# Patient Record
Sex: Female | Born: 1975 | Race: White | Hispanic: No | Marital: Married | Smoking: Never smoker
Health system: Southern US, Community
[De-identification: ages and names within clinical notes are randomized; demographics above are authoritative.]

## PROBLEM LIST (undated history)

## (undated) DIAGNOSIS — F32A Depression, unspecified: Secondary | ICD-10-CM

## (undated) DIAGNOSIS — T7840XA Allergy, unspecified, initial encounter: Secondary | ICD-10-CM

## (undated) DIAGNOSIS — F419 Anxiety disorder, unspecified: Secondary | ICD-10-CM

## (undated) DIAGNOSIS — G902 Horner's syndrome: Secondary | ICD-10-CM

## (undated) DIAGNOSIS — D649 Anemia, unspecified: Secondary | ICD-10-CM

## (undated) DIAGNOSIS — Z87442 Personal history of urinary calculi: Secondary | ICD-10-CM

## (undated) DIAGNOSIS — Z98811 Dental restoration status: Secondary | ICD-10-CM

## (undated) DIAGNOSIS — G5601 Carpal tunnel syndrome, right upper limb: Secondary | ICD-10-CM

## (undated) HISTORY — DX: Allergy, unspecified, initial encounter: T78.40XA

## (undated) HISTORY — DX: Anemia, unspecified: D64.9

## (undated) HISTORY — PX: EYE SURGERY: SHX253

---

## 1998-01-15 ENCOUNTER — Emergency Department (HOSPITAL_COMMUNITY): Admission: EM | Admit: 1998-01-15 | Discharge: 1998-01-15 | Payer: Self-pay | Admitting: Emergency Medicine

## 1998-01-24 ENCOUNTER — Emergency Department (HOSPITAL_COMMUNITY): Admission: EM | Admit: 1998-01-24 | Discharge: 1998-01-24 | Payer: Self-pay | Admitting: Emergency Medicine

## 1998-06-05 ENCOUNTER — Other Ambulatory Visit: Admission: RE | Admit: 1998-06-05 | Discharge: 1998-06-05 | Payer: Self-pay | Admitting: Obstetrics and Gynecology

## 1998-07-23 ENCOUNTER — Other Ambulatory Visit: Admission: RE | Admit: 1998-07-23 | Discharge: 1998-07-23 | Payer: Self-pay | Admitting: *Deleted

## 1998-11-20 ENCOUNTER — Other Ambulatory Visit: Admission: RE | Admit: 1998-11-20 | Discharge: 1998-11-20 | Payer: Self-pay | Admitting: *Deleted

## 1998-11-20 ENCOUNTER — Other Ambulatory Visit: Admission: RE | Admit: 1998-11-20 | Discharge: 1998-11-20 | Payer: Self-pay | Admitting: Obstetrics and Gynecology

## 1999-04-14 ENCOUNTER — Other Ambulatory Visit: Admission: RE | Admit: 1999-04-14 | Discharge: 1999-04-14 | Payer: Self-pay | Admitting: Obstetrics and Gynecology

## 1999-10-15 ENCOUNTER — Other Ambulatory Visit: Admission: RE | Admit: 1999-10-15 | Discharge: 1999-10-15 | Payer: Self-pay | Admitting: *Deleted

## 1999-12-05 ENCOUNTER — Inpatient Hospital Stay (HOSPITAL_COMMUNITY): Admission: AD | Admit: 1999-12-05 | Discharge: 1999-12-05 | Payer: Self-pay | Admitting: Obstetrics and Gynecology

## 1999-12-10 ENCOUNTER — Inpatient Hospital Stay (HOSPITAL_COMMUNITY): Admission: AD | Admit: 1999-12-10 | Discharge: 1999-12-12 | Payer: Self-pay | Admitting: Obstetrics and Gynecology

## 2000-01-08 ENCOUNTER — Other Ambulatory Visit: Admission: RE | Admit: 2000-01-08 | Discharge: 2000-01-08 | Payer: Self-pay | Admitting: Obstetrics and Gynecology

## 2000-03-01 ENCOUNTER — Encounter (INDEPENDENT_AMBULATORY_CARE_PROVIDER_SITE_OTHER): Payer: Self-pay | Admitting: Specialist

## 2000-03-01 ENCOUNTER — Other Ambulatory Visit: Admission: RE | Admit: 2000-03-01 | Discharge: 2000-03-01 | Payer: Self-pay | Admitting: Obstetrics and Gynecology

## 2000-06-07 ENCOUNTER — Other Ambulatory Visit: Admission: RE | Admit: 2000-06-07 | Discharge: 2000-06-07 | Payer: Self-pay | Admitting: Obstetrics and Gynecology

## 2000-10-13 ENCOUNTER — Other Ambulatory Visit: Admission: RE | Admit: 2000-10-13 | Discharge: 2000-10-13 | Payer: Self-pay | Admitting: Obstetrics and Gynecology

## 2001-02-14 ENCOUNTER — Other Ambulatory Visit: Admission: RE | Admit: 2001-02-14 | Discharge: 2001-02-14 | Payer: Self-pay | Admitting: Obstetrics and Gynecology

## 2001-09-05 ENCOUNTER — Other Ambulatory Visit: Admission: RE | Admit: 2001-09-05 | Discharge: 2001-09-05 | Payer: Self-pay | Admitting: Obstetrics and Gynecology

## 2002-02-20 ENCOUNTER — Other Ambulatory Visit: Admission: RE | Admit: 2002-02-20 | Discharge: 2002-02-20 | Payer: Self-pay | Admitting: Obstetrics and Gynecology

## 2003-05-02 ENCOUNTER — Other Ambulatory Visit: Admission: RE | Admit: 2003-05-02 | Discharge: 2003-05-02 | Payer: Self-pay | Admitting: Obstetrics and Gynecology

## 2003-11-20 ENCOUNTER — Other Ambulatory Visit: Admission: RE | Admit: 2003-11-20 | Discharge: 2003-11-20 | Payer: Self-pay | Admitting: Obstetrics and Gynecology

## 2004-05-07 ENCOUNTER — Inpatient Hospital Stay (HOSPITAL_COMMUNITY): Admission: AD | Admit: 2004-05-07 | Discharge: 2004-05-07 | Payer: Self-pay | Admitting: Obstetrics and Gynecology

## 2004-05-08 ENCOUNTER — Inpatient Hospital Stay (HOSPITAL_COMMUNITY): Admission: AD | Admit: 2004-05-08 | Discharge: 2004-05-08 | Payer: Self-pay | Admitting: Obstetrics and Gynecology

## 2004-06-12 ENCOUNTER — Inpatient Hospital Stay (HOSPITAL_COMMUNITY): Admission: AD | Admit: 2004-06-12 | Discharge: 2004-06-14 | Payer: Self-pay | Admitting: Obstetrics and Gynecology

## 2004-07-09 ENCOUNTER — Encounter: Admission: RE | Admit: 2004-07-09 | Discharge: 2004-07-09 | Payer: Self-pay | Admitting: Obstetrics and Gynecology

## 2009-05-25 ENCOUNTER — Emergency Department (HOSPITAL_COMMUNITY): Admission: EM | Admit: 2009-05-25 | Discharge: 2009-05-25 | Payer: Self-pay | Admitting: Family Medicine

## 2010-09-29 ENCOUNTER — Ambulatory Visit (HOSPITAL_COMMUNITY)
Admission: RE | Admit: 2010-09-29 | Discharge: 2010-09-29 | Disposition: A | Discharge status: Home / Self Care | Payer: BC Managed Care – PPO | Source: Ambulatory Visit | Attending: Urology | Admitting: Urology

## 2010-09-29 DIAGNOSIS — N201 Calculus of ureter: Secondary | ICD-10-CM | POA: Insufficient documentation

## 2010-09-29 HISTORY — PX: EXTRACORPOREAL SHOCK WAVE LITHOTRIPSY: SHX1557

## 2010-09-29 LAB — PREGNANCY, URINE: Preg Test, Ur: NEGATIVE

## 2010-10-19 DIAGNOSIS — S83006A Unspecified dislocation of unspecified patella, initial encounter: Secondary | ICD-10-CM | POA: Insufficient documentation

## 2010-11-19 DIAGNOSIS — Z9889 Other specified postprocedural states: Secondary | ICD-10-CM | POA: Insufficient documentation

## 2010-12-19 NOTE — H&P (Signed)
[facility]  Patient:    Lindsay Valencia, Lindsay Valencia                      MRN: [id number] Adm. Date:  [id number] Attending:  Lenoard Aden                         History and Physical  CHIEF COMPLAINT:              Advanced dilatation at term.  HISTORY OF PRESENT ILLNESS:   A 35 year old G3-1-0-1-1, EDD Dec 25, 1999, at 38 weeks, with 4-5 cm dilatation, also small for gestational age fetus, who presents for induction and delivery.  MEDICATIONS:                  Prenatal vitamins, Zoloft, and iron.  PAST MEDICAL HISTORY:         Remarkable for an uncomplicated vaginal delivery in 1998, 6 pound 10 ounce female, spontaneous pregnancy loss 1997.  The patient has a history of kidney stones, history of depression.  No other medical or surgical hospitalizations.  FAMILY HISTORY:               Hypertension and heart disease.  PREGNANCY HISTORY:            Prenatal laboratory data:  Blood type O positive, Rh antibody negative.  Toxoplasma titer is negative.  VDRL nonreactive.  Rubella immune.  Hepatitis B surface antigen negative. Pregnancy complicated by small for gestational age fetus and advanced preterm cervical dilatation, stable on bed rest.  PHYSICAL EXAMINATION:  GENERAL:                      She is a well-developed, well-nourished white female in no apparent distress.  HEENT:                        Normal.  LUNGS:                        Clear.  HEART:                        Regular rate and rhythm.  ABDOMEN:                      Soft, gravid, nontender.  Estimated fetal weight 6 pounds.  PELVIC:                       Cervix 4 cm, 80% effaced, vertex, and 0 station.  EXTREMITIES:                  There are no cords.  NEUROLOGIC:                   Nonfocal.  IMPRESSION:                   Small for gestational age fetus with advanced dilatation, now at term.  PLAN:                         Proceed with induction.  Anticipate  vaginal delivery. DD:  12/10/99 TD:  12/10/99 Job: [id number]

## 2011-01-12 ENCOUNTER — Other Ambulatory Visit (HOSPITAL_COMMUNITY): Payer: Self-pay | Admitting: Obstetrics and Gynecology

## 2011-01-12 DIAGNOSIS — N979 Female infertility, unspecified: Secondary | ICD-10-CM

## 2011-01-14 ENCOUNTER — Ambulatory Visit (HOSPITAL_COMMUNITY)
Admission: RE | Admit: 2011-01-14 | Discharge: 2011-01-14 | Disposition: A | Discharge status: Home / Self Care | Payer: BC Managed Care – PPO | Source: Ambulatory Visit | Attending: Obstetrics and Gynecology | Admitting: Obstetrics and Gynecology

## 2011-01-14 DIAGNOSIS — N979 Female infertility, unspecified: Secondary | ICD-10-CM | POA: Insufficient documentation

## 2011-05-28 LAB — OB RESULTS CONSOLE ANTIBODY SCREEN: Antibody Screen: NEGATIVE

## 2011-06-10 LAB — OB RESULTS CONSOLE GC/CHLAMYDIA
Chlamydia: NEGATIVE
Gonorrhea: NEGATIVE

## 2011-08-04 NOTE — L&D Delivery Note (Signed)
Delivery Note At 3:22 PM a viable and healthy female was delivered via Vaginal, Spontaneous Delivery (Presentation: ROA ).  APGAR: pending. Weight 7 pounds and 10oz .   Placenta status: spontaneous with 3Vessel Cord:  with the following complications: Nuchal cord x 2 reduced on perineum .  Cord pH: na  Anesthesia:  None  Episiotomy: none Lacerations: second degree Suture Repair: 3.0 vicryl rapide Est. Blood Loss (mL): 500  Mom to postpartum.  Baby to nursery-stable.  Branson Kranz J 12/17/2011, 3:37 PM

## 2011-10-14 LAB — OB RESULTS CONSOLE HGB/HCT, BLOOD: Hemoglobin: 11.4 g/dL

## 2011-10-14 LAB — OB RESULTS CONSOLE PLATELET COUNT: Platelets: 175 10*3/uL

## 2011-10-14 LAB — OB RESULTS CONSOLE ABO/RH

## 2011-12-17 ENCOUNTER — Inpatient Hospital Stay (HOSPITAL_COMMUNITY)
Admission: AD | Admit: 2011-12-17 | Discharge: 2011-12-19 | DRG: 373 | Disposition: A | Discharge status: Home / Self Care | Payer: BC Managed Care – PPO | Source: Ambulatory Visit | Attending: Obstetrics and Gynecology | Admitting: Obstetrics and Gynecology

## 2011-12-17 ENCOUNTER — Encounter (HOSPITAL_COMMUNITY): Payer: Self-pay | Admitting: *Deleted

## 2011-12-17 DIAGNOSIS — O9903 Anemia complicating the puerperium: Secondary | ICD-10-CM | POA: Diagnosis not present

## 2011-12-17 DIAGNOSIS — D62 Acute posthemorrhagic anemia: Secondary | ICD-10-CM | POA: Diagnosis not present

## 2011-12-17 DIAGNOSIS — O09529 Supervision of elderly multigravida, unspecified trimester: Secondary | ICD-10-CM | POA: Diagnosis present

## 2011-12-17 LAB — OB RESULTS CONSOLE GBS: GBS: NEGATIVE

## 2011-12-17 LAB — CBC
MCHC: 31.7 g/dL (ref 30.0–36.0)
RDW: 13.9 % (ref 11.5–15.5)

## 2011-12-17 LAB — OB RESULTS CONSOLE HIV ANTIBODY (ROUTINE TESTING): HIV: NONREACTIVE

## 2011-12-17 LAB — RPR: RPR Ser Ql: NONREACTIVE

## 2011-12-17 MED ORDER — CITRIC ACID-SODIUM CITRATE 334-500 MG/5ML PO SOLN: 30.0000 mL | ORAL | Status: DC | PRN

## 2011-12-17 MED ORDER — OXYTOCIN BOLUS FROM INFUSION
500.0000 mL | Freq: Once | INTRAVENOUS | Status: DC
  Filled 2011-12-17: qty 1000
  Filled 2011-12-17: qty 500

## 2011-12-17 MED ORDER — SERTRALINE HCL 50 MG PO TABS
125.0000 mg | ORAL_TABLET | Freq: Every day | ORAL | Status: DC
  Administered 2011-12-17 – 2011-12-19 (×3): 125 mg via ORAL
  Filled 2011-12-17 (×4): qty 2.5

## 2011-12-17 MED ORDER — BENZOCAINE-MENTHOL 20-0.5 % EX AERO: 1.0000 "application " | INHALATION_SPRAY | CUTANEOUS | Status: DC | PRN

## 2011-12-17 MED ORDER — IBUPROFEN 600 MG PO TABS: 600.0000 mg | ORAL_TABLET | Freq: Four times a day (QID) | ORAL | Status: DC | PRN

## 2011-12-17 MED ORDER — ZOLPIDEM TARTRATE 5 MG PO TABS: 5.0000 mg | ORAL_TABLET | Freq: Every evening | ORAL | Status: DC | PRN

## 2011-12-17 MED ORDER — DIPHENHYDRAMINE HCL 25 MG PO CAPS: 25.0000 mg | ORAL_CAPSULE | Freq: Four times a day (QID) | ORAL | Status: DC | PRN

## 2011-12-17 MED ORDER — OXYTOCIN 20 UNITS IN LACTATED RINGERS INFUSION - SIMPLE
125.0000 mL/h | Freq: Once | INTRAVENOUS | Status: AC
  Administered 2011-12-17: 999 mL/h via INTRAVENOUS

## 2011-12-17 MED ORDER — LACTATED RINGERS IV SOLN
INTRAVENOUS | Status: DC
  Administered 2011-12-17: 12:00:00 via INTRAVENOUS

## 2011-12-17 MED ORDER — ONDANSETRON HCL 4 MG/2ML IJ SOLN: 4.0000 mg | Freq: Four times a day (QID) | INTRAMUSCULAR | Status: DC | PRN

## 2011-12-17 MED ORDER — SERTRALINE HCL 25 MG PO TABS: 25.0000 mg | ORAL_TABLET | Freq: Every day | ORAL | Status: DC

## 2011-12-17 MED ORDER — DIBUCAINE 1 % RE OINT: 1.0000 "application " | TOPICAL_OINTMENT | RECTAL | Status: DC | PRN

## 2011-12-17 MED ORDER — ACETAMINOPHEN 325 MG PO TABS: 650.0000 mg | ORAL_TABLET | ORAL | Status: DC | PRN

## 2011-12-17 MED ORDER — BUTORPHANOL TARTRATE 2 MG/ML IJ SOLN
1.0000 mg | Freq: Once | INTRAMUSCULAR | Status: AC
  Administered 2011-12-17: 1 mg via INTRAVENOUS

## 2011-12-17 MED ORDER — FLEET ENEMA 7-19 GM/118ML RE ENEM: 1.0000 | ENEMA | RECTAL | Status: DC | PRN

## 2011-12-17 MED ORDER — SIMETHICONE 80 MG PO CHEW: 80.0000 mg | CHEWABLE_TABLET | ORAL | Status: DC | PRN

## 2011-12-17 MED ORDER — WITCH HAZEL-GLYCERIN EX PADS: 1.0000 "application " | MEDICATED_PAD | CUTANEOUS | Status: DC | PRN

## 2011-12-17 MED ORDER — METHYLERGONOVINE MALEATE 0.2 MG/ML IJ SOLN: 0.2000 mg | INTRAMUSCULAR | Status: DC | PRN

## 2011-12-17 MED ORDER — LIDOCAINE HCL (PF) 1 % IJ SOLN
30.0000 mL | INTRAMUSCULAR | Status: DC | PRN
  Administered 2011-12-17: 30 mL via SUBCUTANEOUS
  Filled 2011-12-17: qty 30

## 2011-12-17 MED ORDER — NALOXONE HCL 0.4 MG/ML IJ SOLN
INTRAMUSCULAR | Status: AC
  Filled 2011-12-17: qty 1

## 2011-12-17 MED ORDER — ONDANSETRON HCL 4 MG/2ML IJ SOLN: 4.0000 mg | INTRAMUSCULAR | Status: DC | PRN

## 2011-12-17 MED ORDER — IBUPROFEN 600 MG PO TABS
600.0000 mg | ORAL_TABLET | Freq: Four times a day (QID) | ORAL | Status: DC
  Administered 2011-12-17 – 2011-12-19 (×7): 600 mg via ORAL
  Filled 2011-12-17 (×7): qty 1

## 2011-12-17 MED ORDER — BUTORPHANOL TARTRATE 2 MG/ML IJ SOLN
INTRAMUSCULAR | Status: AC
  Administered 2011-12-17: 1 mg via INTRAVENOUS
  Filled 2011-12-17: qty 1

## 2011-12-17 MED ORDER — OXYTOCIN 20 UNITS IN LACTATED RINGERS INFUSION - SIMPLE: 125.0000 mL/h | INTRAVENOUS | Status: DC

## 2011-12-17 MED ORDER — OXYCODONE-ACETAMINOPHEN 5-325 MG PO TABS: 1.0000 | ORAL_TABLET | ORAL | Status: DC | PRN

## 2011-12-17 MED ORDER — ONDANSETRON HCL 4 MG PO TABS: 4.0000 mg | ORAL_TABLET | ORAL | Status: DC | PRN

## 2011-12-17 MED ORDER — LACTATED RINGERS IV SOLN: 500.0000 mL | INTRAVENOUS | Status: DC | PRN

## 2011-12-17 MED ORDER — METHYLERGONOVINE MALEATE 0.2 MG PO TABS: 0.2000 mg | ORAL_TABLET | ORAL | Status: DC | PRN

## 2011-12-17 MED ORDER — TETANUS-DIPHTH-ACELL PERTUSSIS 5-2.5-18.5 LF-MCG/0.5 IM SUSP
0.5000 mL | Freq: Once | INTRAMUSCULAR | Status: AC
  Administered 2011-12-18: 0.5 mL via INTRAMUSCULAR
  Filled 2011-12-17: qty 0.5

## 2011-12-17 MED ORDER — PRENATAL MULTIVITAMIN CH
1.0000 | ORAL_TABLET | Freq: Every day | ORAL | Status: DC
  Administered 2011-12-17 – 2011-12-19 (×3): 1 via ORAL
  Filled 2011-12-17 (×3): qty 1

## 2011-12-17 MED ORDER — LANOLIN HYDROUS EX OINT: TOPICAL_OINTMENT | CUTANEOUS | Status: DC | PRN

## 2011-12-17 MED ORDER — SENNOSIDES-DOCUSATE SODIUM 8.6-50 MG PO TABS
2.0000 | ORAL_TABLET | Freq: Every day | ORAL | Status: DC
  Administered 2011-12-17 – 2011-12-18 (×2): 2 via ORAL

## 2011-12-17 NOTE — Progress Notes (Signed)
Lindsay Valencia is a 36 y.o. [phone] at Unknown by LMP admitted for active labor  Subjective: Feels contractions getting stronger  Objective: BP 130/78  Pulse 92  Temp(Src) 97.9 F (36.6 C) (Oral)  Resp 18  Ht 5\' 4"  (1.626 m)  Wt 85.276 kg (188 lb)  BMI 32.27 kg/m2      FHT:  FHR: 150 bpm, variability: moderate,  accelerations:  Present,  decelerations:  Absent UC:   regular, every 2 minutes SVE:    5-6/80/-1  Labs: Lab Results  Component Value Date   HGB 11.4 10/14/2011   HCT 32 10/14/2011   PLT 175 10/14/2011    Assessment / Plan: Spontaneous labor, progressing normally  Labor: Progressing normally Preeclampsia:  na Fetal Wellbeing:  Category I Pain Control:  Labor support without medications I/D:  n/a Anticipated MOD:  NSVD  Lindsay Valencia 12/17/2011, 1:06 PM

## 2011-12-18 ENCOUNTER — Encounter (HOSPITAL_COMMUNITY): Payer: Self-pay

## 2011-12-18 LAB — CBC
HCT: 26.4 % — ABNORMAL LOW (ref 36.0–46.0)
Hemoglobin: 8.5 g/dL — ABNORMAL LOW (ref 12.0–15.0)
MCH: 25.4 pg — ABNORMAL LOW (ref 26.0–34.0)
MCV: 78.8 fL (ref 78.0–100.0)
RBC: 3.35 MIL/uL — ABNORMAL LOW (ref 3.87–5.11)

## 2011-12-18 MED ORDER — POLYSACCHARIDE IRON COMPLEX 150 MG PO CAPS
150.0000 mg | ORAL_CAPSULE | Freq: Two times a day (BID) | ORAL | Status: DC
  Administered 2011-12-18 – 2011-12-19 (×3): 150 mg via ORAL
  Filled 2011-12-18 (×5): qty 1

## 2011-12-18 NOTE — H&P (Signed)
Lindsay Valencia, Lindsay Valencia               ACCOUNT NO.:  [id number]  MEDICAL RECORD NO.:  [id number]  LOCATION:  9110                          FACILITY:  [facility]  PHYSICIAN:  Lenoard Aden, M.D.DATE OF BIRTH:  Jan 31, 1976  DATE OF ADMISSION:  12/17/2011 DATE OF DISCHARGE:                             HISTORY & PHYSICAL   CHIEF COMPLAINT:  Labor.  HISTORY OF PRESENT ILLNESS:  She is a 36 year old white female, G6, P3 at 36-5/7th weeks gestation and active labor.  MEDICATIONS:  Zoloft and prenatal vitamins.  SOCIAL HISTORY:  She is a nonsmoker and nondrinker.  Denies domestic or physical violence.  ALLERGIES:  She has no known drug allergies.  She has a history of kidney stone, depression, OCD, also on Zoloft.  FAMILY HISTORY:  She has a family history of skin cancer, heart disease. She has a previous history of three vaginal deliveries and three spontaneous pregnancy losses.  Prenatal course was complicated by advanced cervical dilatation.  PHYSICAL EXAMINATION:  GENERAL:  Well-developed, well-nourished, white female, in no acute distress. HEENT:  Normal. NECK:  Supple.  Full range of motion. LUNGS:  Clear. HEART:  Regular rate and rhythm. ABDOMEN:  Soft, gravid, nontender.  Estimated fetal weight 7 pounds. Cervix is 5 cm, 90%, vertex at -1. EXTREMITIES:  No cords. NEUROLOGIC:  Nonfocal. SKIN:  Intact.  IMPRESSION:  Preterm intrauterine pregnancy and active labor.  PLAN:  Anticipate attempts at vaginal delivery.     Lenoard Aden, M.D.     RJT/MEDQ  D:  12/17/2011  T:  12/18/2011  Job:  [id number]

## 2011-12-18 NOTE — Progress Notes (Signed)
Patient ID: Lindsay Valencia, female   DOB: 1975/12/09, 35 y.o.   MRN: [id number] PPD # 1  Subjective: Pt reports feeling well, tired/ Pain controlled with ibuprofen Tolerating po/ Voiding without problems/ No n/v Bleeding is moderate Newborn info:  Information for the patient's newborn:  [name], [name] [name] [[id number]]  female Feeding: breast   Objective:  VS: Blood pressure 132/86, pulse 98, temperature 98.6 F (37 C), temperature source Oral, resp. rate 20, height 5\' 4"  (1.626 m), weight 85.276 kg (188 lb), SpO2 99.00%, unknown if currently breastfeeding.    Basename 12/18/11 0540 12/17/11 1219  WBC 12.0* 9.3  HGB 8.5* 10.7*  HCT 26.4* 33.8*  PLT 155 173    Blood type: --/--/O POS (05/16 1300) Rubella: Immune (10/25 0000)    Physical Exam:  General: A & O x 3  alert, cooperative and no distress CV: Regular rate and rhythm Resp: clear Abdomen: soft, nontender, normal bowel sounds Uterine Fundus: firm, below umbilicus, nontender Perineum: healing with good reapproximation and mild edema Lochia: moderate Ext: no edema, redness or tenderness in the calves or thighs   A/P: PPD # 1/ [id number]/ S/P:spontaneous vaginal ABL Anemia; initiated niferex today Doing well Continue routine post partum orders Anticipate D/C home in AM    Demetrius Revel, MSN, [facility] 12/18/2011, 9:31 AM

## 2011-12-19 MED ORDER — POLYSACCHARIDE IRON COMPLEX 150 MG PO CAPS: 150.0000 mg | ORAL_CAPSULE | Freq: Two times a day (BID) | ORAL | Status: DC

## 2011-12-19 MED ORDER — IBUPROFEN 600 MG PO TABS: 600.0000 mg | ORAL_TABLET | Freq: Four times a day (QID) | ORAL | Status: AC

## 2011-12-19 NOTE — Progress Notes (Signed)
Post Partum Day #2            Information for the patient's newborn:  Lindsay, Piche Girl Valencia [[id number]]  female   Feeding: breast  Subjective: No HA, SOB, CP, F/C, breast symptoms. Pain minimal. Normal vaginal bleeding, no clots.      Objective:  Temp:  [97.9 F (36.6 C)-98.4 F (36.9 C)] 97.9 F (36.6 C) (05/18 0535) Pulse Rate:  [76-83] 76  (05/18 0535) Resp:  [18] 18  (05/18 0535) BP: (121-133)/(81-84) 133/84 mmHg (05/18 0535)  No intake or output data in the 24 hours ending 12/19/11 0915     Basename 12/18/11 0540 12/17/11 1219  WBC 12.0* 9.3  HGB 8.5* 10.7*  HCT 26.4* 33.8*  PLT 155 173    Blood type: --/--/O POS (05/16 1300) Rubella: Immune (10/25 0000)    Physical Exam:  General: alert, cooperative and no distress Uterine Fundus: firm Lochia: appropriate Perineum: repair intact, edema none DVT Evaluation: Negative Homan's sign. No significant calf/ankle edema.    Assessment/Plan: PPD # 2 / 36 y.o., [id number] S/P:spontaneous vaginal   Principal Problem:  *PP SVD 12/17/11 F ABL anemia superimposed on chronic anemia   Continue oral Fe 4-6 wks Hx Depression stable on meds  Continue Zoloft as before, PPD s/s reviewed    normal postpartum exam  Continue current postpartum care  D/C home   LOS: 2 days   Geremy Rister, CNM, MSN 12/19/2011, 9:15 AM

## 2011-12-19 NOTE — Discharge Instructions (Signed)
Breast Pumping Tips Pumping your breast milk is a good way to stimulate milk production and have a steady supply of breast milk for your infant. Pumping is most helpful during your infant's growth spurts, when involving dad or a family member, or when you are away. There are several types of pumps available. They can be purchased at a baby or maternity store. You can begin pumping soon after delivery, but some experts believe that you should wait about four weeks to give your infant a bottle. In general, the more you breastfeed or pump, the more milk you will have for your infant. It is also important to take good care of yourself. This will reduce stress and help your body to create a healthy supply of milk. Your caregiver or lactation consultant can give you the information and support you need in your efforts to breastfeed your infant. PUMPING BREAST MILK  Follow the tips below for successful breast pumping. Take care of yourself.  Drink enough water or fluids to keep urine clear or pale yellow. You may notice a thirsty feeling while breastfeeding. This is because your body needs more water to make breast milk. Keep a large water bottle handy. Make healthy drink choices such as unsweetened fruit juice, milk and water. Limit soda, coffee, and alcohol (wait 2 hours to feed or pump if you have an alcoholic drink.)   Eat a healthy, well-balanced diet rich in fruits, vegetables, and whole grains.   Exercise as recommended by your caregiver.   Get plenty of sleep. Sleep when your infant sleeps. Ask friends and family for help if you need time to nap or rest.   Do not smoke. Smoking can lower your milk supply and harm your infant. If you need help quitting, ask your caregiver for a program recommendation.   Ask your caregiver about birth control options. Birth control pills may lower your milk supply. You may be advised to use condoms or other forms of birth control.  Relax and pump Stimulating your  let-down reflex is the key to successful and effective pumping. This makes the milk in all parts of the breast flow more freely.   It is easier to pump breast milk (and breastfeed) while you are relaxed. Find techniques that work for you. Quiet private spaces, breast massage, soothing heat placed on the breast, music, and pictures or a tape recording of your infant may help you to relax and "let down" your milk. If you have difficulty with your let down, try smelling one of your infant's blankets or an item of clothing he or she has worn while you are pumping.   When pumping, place the special suction cup (flange) directly over the nipple. It may be uncomfortable and cause nipple damage if it is not placed properly or is the wrong size. Applying a small amount of purified or modified lanolin to your nipple and the areola may help increase your comfort level. Also, you can change the speed and suction of many electric pumps to your comfort level. Your caregiver or lactation consultant can help you with this.   If pumping continues to be painful, or you feel you are not getting very much milk when you pump, you may need a different type of pump. A lactation consultant can help you determine if this is the case.   If you are with your infant, feed him or her on demand and try pumping after each feeding. This will boost your production, even if milk   does not come out. You may not be able to pump much milk at first, but keep up the routine, and this will change.   If you are working or away from your infant for several hours, try pumping for about 15 minutes every 2 to 3 hours. Pump both breasts at the same time if you can.   If your infant has a formula feeding, make sure you pump your milk around the same time to maintain your supply.   Begin pumping breast milk a few weeks before you return to work. This will help you develop techniques that work for you and will be able to store extra milk.   Find a  source of breastfeeding information that works well for you.  TIPS FOR STORING BREAST MILK  Store breast milk in a sealable sterile bag, jar, or container provided with your pumping supplies.   Store milk in small amounts close to what your infant is drinking at each feeding.   Cool pumped milk in a refrigerator or cooler. Pumped milk can last at the back of the refrigerator for 3 to 8 days.   Place cooled milk at the back of the freezer for up to 3 months.   Thaw the milk in its container or bag in warm water up to 24 hours in advance. Do not use a microwave to thaw or heat milk. Do not refreeze the milk after it has been thawed.   Breast milk is safe to drink when left at room temperature (mid 70s or colder) for 4 to 8 hours. After that, throw it away.   Milk fat can separate and look funny. The color can vary slightly from day to day. This is normal. Always shake the milk before using it to mix the fat with the more watery portion.  SEEK MEDICAL CARE IF:   You are having trouble pumping or feeding your infant.   You are concerned that you are not making enough milk.   You have nipple pain, soreness, or redness.   You have other questions or concerns related to you or your infant.  Document Released: 01/07/2010 Document Revised: 07/09/2011 Document Reviewed: 01/07/2010 Exit[employer] Patient Information 2012 [employer].Postpartum Depression and Baby Blues The postpartum period begins right after the birth of a baby. During this time, there is often a great amount of joy and excitement. It is also a time of considerable changes in the life of the parent(s). Regardless of how many times a mother gives birth, each child brings new challenges and dynamics to the family. It is not unusual to have feelings of excitement accompanied by confusing shifts in moods, emotions, and thoughts. All mothers are at risk of developing postpartum depression or the "baby blues." These mood changes can occur  right after giving birth, or they may occur many months after giving birth. The baby blues or postpartum depression can be mild or severe. Additionally, postpartum depression can resolve rather quickly, or it can be a long-term condition. CAUSES Elevated hormones and their rapid decline are thought to be a main cause of postpartum depression and the baby blues. There are a number of hormones that radically change during and after pregnancy. Estrogen and progesterone usually decrease immediately after delivering your baby. The level of thyroid hormone and various cortisol steroids also rapidly drop. Other factors that play a major role in these changes include major life events and genetics.  RISK FACTORS If you have any of the following risks for the   baby blues or postpartum depression, know what symptoms to watch out for during the postpartum period. Risk factors that may increase the likelihood of getting the baby blues or postpartum depression include:  Havinga personal or family history of depression.   Having depression while being pregnant.   Having premenstrual or oral contraceptive-associated mood issues.   Having exceptional life stress.   Having marital conflict.   Lacking a social support network.   Having a baby with special needs.   Having health problems such as diabetes.  SYMPTOMS Baby blues symptoms include:  Brief fluctuations in mood, such as going from extreme happiness to sadness.   Decreased concentration.   Difficulty sleeping.   Crying spells, tearfulness.   Irritability.   Anxiety.  Postpartum depression symptoms typically begin within the first month after giving birth. These symptoms include:  Difficulty sleeping or excessive sleepiness.   Marked weight loss.   Agitation.   Feelings of worthlessness.   Lack of interest in activity or food.  Postpartum psychosis is a very concerning condition and can be dangerous. Fortunately, it is rare.  Displaying any of the following symptoms is cause for immediate medical attention. Postpartum psychosis symptoms include:  Hallucinations and delusions.   Bizarre or disorganized behavior.   Confusion or disorientation.  DIAGNOSIS  A diagnosis is made by an evaluation of your symptoms. There are no medical or lab tests that lead to a diagnosis, but there are various questionnaires that a caregiver may use to identify those with the baby blues, postpartum depression, or psychosis. Often times, a screening tool called the Edinburgh Postnatal Depression Scale is used to diagnose depression in the postpartum period.  TREATMENT The baby blues usually goes away on its own in 1 to 2 weeks. Social support is often all that is needed. You should be encouraged to get adequate sleep and rest. Occasionally, you may be given medicines to help you sleep.  Postpartum depression requires treatment as it can last several months or longer if it is not treated. Treatment may include individual or group therapy, medicine, or both to address any social, physiological, and psychological factors that may play a role in the depression. Regular exercise, a healthy diet, rest, and social support may also be strongly recommended.  Postpartum psychosis is more serious and needs treatment right away. Hospitalization is often needed. HOME CARE INSTRUCTIONS  Get as much rest as you can. Nap when the baby sleeps.   Exercise regularly. Some women find yoga and walking to be beneficial.   Eat a balanced and nourishing diet.   Do little things that you enjoy. Have a cup of tea, take a bubble bath, read your favorite magazine, or listen to your favorite music.   Avoid alcohol.   Ask for help with household chores, cooking, grocery shopping, or running errands as needed. Do not try to do everything.   Talk to people close to you about how you are feeling. Get support from your partner, family members, friends, or other new  moms.   Try to stay positive in how you think. Think about the things you are grateful for.   Do not spend a lot of time alone.   Only take medicine as directed by your caregiver.   Keep all your postpartum appointments.   Let your caregiver know if you have any concerns.  SEEK MEDICAL CARE IF: You are having a reaction or problems with your medicine. SEEK IMMEDIATE MEDICAL CARE IF:  You have suicidal feelings.     You feel you may harm the baby or someone else.  Document Released: 04/23/2004 Document Revised: 07/09/2011 Document Reviewed: 05/26/2011 Exit[employer] Patient Information 2012 [employer]. 

## 2011-12-19 NOTE — Discharge Summary (Signed)
Obstetric Discharge Summary Reason for Admission: onset of labor Prenatal Procedures: ultrasound Intrapartum Procedures: spontaneous vaginal delivery Postpartum Procedures: TDaP vaccine Complications-Operative and Postpartum: 2nd degree perineal laceration Hemoglobin  Date Value Range Status  12/18/2011 8.5* 12.0-15.0 (g/dL) Final     REPEATED TO VERIFY     DELTA CHECK NOTED  10/14/2011 11.4   Final     HCT  Date Value Range Status  12/18/2011 26.4* 36.0-46.0 (%) Final  10/14/2011 32   Final    Physical Exam:  General: alert, cooperative and no distress Lochia: appropriate Uterine Fundus: firm Incision: healing well DVT Evaluation: Negative Homan's sign. No significant calf/ankle edema.  Discharge Diagnoses: Term Pregnancy-delivered Acute blood loss anemia over chronic iron deficiency anemia  Discharge Information: Date: 12/19/2011 Activity: pelvic rest Diet: routine Medications: PNV, Ibuprofen and Iron Condition: stable Instructions: refer to practice specific booklet Discharge to: home Follow-up Information    Follow up with Lindsay Aden, MD in 6 weeks.   Contact information:   [address] [place] [address] [phone]          Newborn Data: Live born female "Lindsay Valencia" Birth Weight: 7 lb 10 oz (3459 g) APGAR: 7, 9  Home with mother.  Lindsay Valencia 12/19/2011, 9:18 AM

## 2012-11-29 ENCOUNTER — Other Ambulatory Visit: Payer: Self-pay | Admitting: Urology

## 2012-11-30 ENCOUNTER — Encounter (HOSPITAL_COMMUNITY): Payer: Self-pay

## 2012-12-02 ENCOUNTER — Encounter (HOSPITAL_COMMUNITY): Payer: Self-pay | Admitting: Pharmacy Technician

## 2012-12-02 NOTE — H&P (Signed)
Lindsay Valencia is a 36 year old female patient with history of nephrolithiasis.     History of Present Illness       Nephrolithiasis: She has a history of kidney stones.  She had an episode of gross hematuria left flank pain and was evaluated with a CT scan on 09/23/10 which revealed a left ureteral calculus as well as bilateral renal calculi. Her left ureteral calculus was treated with lithotripsy on 09/29/10 with excellent fragmentation of the stone.   Interval history: She began experiencing gross hematuria one week ago. She said she's had some slight sensations in her right flank region but no pain to speak of. The gross hematuria ceased about 3 days ago. She's not seen any stones pass. She has no new voiding symptoms. Her hematuria would be considered of mild to moderate severity with no modifying factors.   Past Medical History Problems  1. History of  Anxiety (Symptom) 300.00 2. History of  Depression 311  Surgical History Problems  1. History of  Eye Surgery Left 2. History of  Lithotripsy  Current Meds 1. ALPRAZolam 0.5 MG Oral Tablet; Therapy: 19Oct2011 to 2. Zoloft 100 MG Oral Tablet; Therapy: (Recorded:21Feb2012) to  Allergies Medication  1. No Known Drug Allergies  Family History Problems  1. Maternal history of  Family Health Status - Father's Age age 36 2. Maternal history of  Family Health Status - Mother's Age age 27 3. Maternal history of  Family Health Status Children ___ Living Daughters 4. Family history of  Family Health Status Children ___ Living Daughters 2 sons1 daughter  Social History Problems  1. Caffeine Use 1 daily 2. Marital History - Currently Married Denied  3. History of  Alcohol Use 4. History of  Tobacco Use  Review of Systems Genitourinary, constitutional, skin, eye, otolaryngeal, hematologic/lymphatic, cardiovascular, pulmonary, endocrine, musculoskeletal, gastrointestinal, neurological and psychiatric system(s) were reviewed and pertinent  findings if present are noted.  Genitourinary: hematuria.  Gastrointestinal: nausea.  Musculoskeletal: back pain.  Psychiatric: depression and anxiety.    Vitals Vital Signs  Height Weight BMI BSA  Weight: 167 lb   Blood Pressure  Blood Pressure: 145 / 101 Temperature  Temperature: 97.2 F Pulse  Heart Rate: 80  Physical Exam Constitutional: Well nourished and well developed. No acute distress.  ENT:. The ears and nose are normal in appearance.  Neck: The appearance of the neck is normal and no neck mass is present.  Pulmonary: No respiratory distress and normal respiratory rhythm and effort.  Cardiovascular: Heart rate and rhythm are normal. No peripheral edema.  Abdomen: The abdomen is soft and nontender. No masses are palpated. No CVA tenderness. No hernias are palpable. No hepatosplenomegaly noted.  Lymphatics: The femoral and inguinal nodes are not enlarged or tender.  Skin: Normal skin turgor, no visible rash and no visible skin lesions.  Neuro/Psych:. Mood and affect are appropriate.   The following images/tracing/specimen were independently visualized:  KUB: She has a 10 mm stone in the area of the right renal pelvis. A smaller stone is seen in the left lower pole. I see no stones along the course of her ureters. The bony skeleton bowel gas pattern appear normal.  The following clinical lab reports were reviewed:  UA: Red cells were noted there was no sign of infection.    Assessment Assessed     Her gross hematuria secondary to a stone located in the renal pelvis on the right-hand side. A 10 mm it has a very low probability of spontaneous  passage. We therefore discussed the treatment options. She has undergone lithotripsy before and this was quite successful at clearing her of previous stones. I therefore have discussed repeating lithotripsy to treat this stone as well. She is in agreement with that plan.   Plan     She will be scheduled for lithotripsy of her right  renal pelvic stone.

## 2012-12-05 ENCOUNTER — Ambulatory Visit (HOSPITAL_COMMUNITY)
Admission: RE | Admit: 2012-12-05 | Discharge: 2012-12-05 | Disposition: A | Discharge status: Home / Self Care | Payer: BC Managed Care – PPO | Source: Ambulatory Visit | Attending: Urology | Admitting: Urology

## 2012-12-05 ENCOUNTER — Encounter (HOSPITAL_COMMUNITY): Payer: Self-pay | Admitting: *Deleted

## 2012-12-05 ENCOUNTER — Encounter (HOSPITAL_COMMUNITY)
Admission: RE | Disposition: A | Discharge status: Home / Self Care | Payer: Self-pay | Source: Ambulatory Visit | Attending: Urology

## 2012-12-05 ENCOUNTER — Ambulatory Visit (HOSPITAL_COMMUNITY): Payer: BC Managed Care – PPO

## 2012-12-05 DIAGNOSIS — N200 Calculus of kidney: Secondary | ICD-10-CM

## 2012-12-05 HISTORY — PX: EXTRACORPOREAL SHOCK WAVE LITHOTRIPSY: SHX1557

## 2012-12-05 HISTORY — DX: Anxiety disorder, unspecified: F41.9

## 2012-12-05 LAB — PREGNANCY, URINE: Preg Test, Ur: NEGATIVE

## 2012-12-05 SURGERY — LITHOTRIPSY, ESWL
Anesthesia: LOCAL | Laterality: Right

## 2012-12-05 MED ORDER — DIPHENHYDRAMINE HCL 25 MG PO CAPS
25.0000 mg | ORAL_CAPSULE | ORAL | Status: AC
  Administered 2012-12-05: 25 mg via ORAL
  Filled 2012-12-05: qty 1

## 2012-12-05 MED ORDER — OXYCODONE-ACETAMINOPHEN 10-325 MG PO TABS: 1.0000 | ORAL_TABLET | ORAL | Status: DC | PRN

## 2012-12-05 MED ORDER — SODIUM CHLORIDE 0.9 % IV SOLN
INTRAVENOUS | Status: DC
  Administered 2012-12-05: 08:00:00 via INTRAVENOUS

## 2012-12-05 MED ORDER — DIAZEPAM 5 MG PO TABS
10.0000 mg | ORAL_TABLET | ORAL | Status: AC
  Administered 2012-12-05: 10 mg via ORAL
  Filled 2012-12-05: qty 2

## 2012-12-05 MED ORDER — CIPROFLOXACIN HCL 500 MG PO TABS
500.0000 mg | ORAL_TABLET | ORAL | Status: AC
  Administered 2012-12-05: 500 mg via ORAL
  Filled 2012-12-05: qty 1

## 2012-12-05 MED ORDER — TAMSULOSIN HCL 0.4 MG PO CAPS: 0.4000 mg | ORAL_CAPSULE | ORAL | Status: DC

## 2012-12-05 NOTE — Op Note (Signed)
See [facility] OP note scanned into chart. 

## 2012-12-05 NOTE — Interval H&P Note (Signed)
History and Physical Interval Note:  12/05/2012 8:55 AM  Lindsay Valencia  has presented today for surgery, with the diagnosis of Right Renal Calculus  The various methods of treatment have been discussed with the patient and family. After consideration of risks, benefits and other options for treatment, the patient has consented to  Procedure(s): RIGHT EXTRACORPOREAL SHOCK WAVE LITHOTRIPSY (ESWL) (Right) as a surgical intervention .  The patient's history has been reviewed, patient examined, no change in status, stable for surgery.  I have reviewed the patient's chart and labs.  Questions were answered to the patient's satisfaction.     [place]

## 2012-12-25 ENCOUNTER — Ambulatory Visit (INDEPENDENT_AMBULATORY_CARE_PROVIDER_SITE_OTHER): Payer: BC Managed Care – PPO | Admitting: Family Medicine

## 2012-12-25 DIAGNOSIS — S61209A Unspecified open wound of unspecified finger without damage to nail, initial encounter: Secondary | ICD-10-CM

## 2012-12-25 DIAGNOSIS — S61012A Laceration without foreign body of left thumb without damage to nail, initial encounter: Secondary | ICD-10-CM

## 2012-12-25 DIAGNOSIS — M79609 Pain in unspecified limb: Secondary | ICD-10-CM

## 2012-12-25 NOTE — Patient Instructions (Addendum)

## 2012-12-25 NOTE — Progress Notes (Signed)
   [address]   [place], [place]  [address]   [id number]  Subjective:    Patient ID: Lindsay Valencia, female    DOB: 1976-04-25, 37 y.o.   MRN: [id number]  HPI This 37 y.o. female presents for evaluation of laceration thumb L.  Slicing cake today for birthday party.  Sliced thumb L.  Excessive bleeding.  Deep cut.  No numbness or tingling.  TDAP 12/2011.  R hand dominant.  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 No n/t.  No weakness.  Full range of motion thumb.  PCP:  [place] [place].  Review of Systems  Constitutional: Negative for fever, chills, diaphoresis and fatigue.  Skin: Positive for wound.  Neurological: Negative for weakness and numbness.    Past Medical History  Diagnosis Date  . PP SVD 12/17/11 F 12/18/2011  . Anxiety   . Nephrolithiasis     Past Surgical History  Procedure Laterality Date  . Lithotripsy  2012    Prior to Admission medications   Medication Sig Start Date End Date Taking? Authorizing Provider  lamoTRIgine (LAMICTAL) 100 MG tablet Take 100 mg by mouth every evening.    Yes Historical Provider, MD  sertraline (ZOLOFT) 100 MG tablet Take 200 mg by mouth every evening.   Yes Historical Provider, MD  oxyCODONE-acetaminophen (PERCOCET) 10-325 MG per tablet Take 1 tablet by mouth every 4 (four) hours as needed for pain. 12/05/12   Garnett Farm, MD  tamsulosin (FLOMAX) 0.4 MG CAPS Take 1 capsule (0.4 mg total) by mouth daily after supper. 12/05/12   Garnett Farm, MD    No Known Allergies  History   Social History  . Marital Status: Married    Spouse Name: N/A    Number of Children: N/A  . Years of Education: N/A   Occupational History  . Not on file.   Social History Main Topics  . Smoking status: Never Smoker   . Smokeless tobacco: Never Used  . Alcohol Use: No  . Drug Use: No  . Sexually Active: Not on file   Other Topics Concern  . Not on file   Social History Narrative  . No narrative on file    No family history on file.     Objective:   Physical Exam  Nursing note and vitals reviewed. Constitutional: She appears well-developed and well-nourished. No distress.  HENT:  Head: Normocephalic and atraumatic.  Musculoskeletal:       Left hand: Normal.       Hands: Full ROM L thumb; full extension/flexion; no laxity.  Motor 5/5 L thumb.  Skin: She is not diaphoretic.  1 cm laceration with good approximation base L thumb.  Psychiatric: She has a normal mood and affect. Her behavior is normal.    PROCEDURE: VERBAL CONSENT OBTAINED; REFER TO SEPARATE PROCEDURE NOTE BY RYAN DUNN, PA-C.      Assessment & Plan:  Pain, hand, left  Laceration of thumb, left, initial encounter   1.  Pain L hand/thumb:  New.  Secondary to laceration.   2.  Laceration L thumb:  New.  S/p suture repair. Local wound care reviewed; RTC 10 days for suture removal. Keep clean and covered.  RTC for s/s infection.

## 2012-12-25 NOTE — Progress Notes (Signed)
   Patient ID: [name] MRN: [id number], DOB: 25-May-1976, 37 y.o. Date of Encounter: 12/25/2012, 4:50 PM   PROCEDURE NOTE: Verbal consent obtained.  Risks and benefits of the procedure were explained. Patient made an informed decision to proceed with the procedure. Sterile technique employed. Numbing: Anesthesia obtained with 2% plain lidocaine 2 cc for local anesthesia.   Cleansed with soap and water. Irrigated. Betadine prep per usual protocol.  Wound explored, no deep structures involved, no foreign bodies.   Wound repaired with # 5 simple interrupted sutures of 5-0 Prolene.  Hemostasis obtained. Wound cleansed and dressed.  Wound care instructions including precautions covered with patient. Handout given.  Anticipate suture removal in 10 days.   SignedEula Listen, PA-C 12/25/2012 4:50 PM

## 2013-04-26 ENCOUNTER — Ambulatory Visit (INDEPENDENT_AMBULATORY_CARE_PROVIDER_SITE_OTHER): Payer: BC Managed Care – PPO | Admitting: Family Medicine

## 2013-04-26 VITALS — BP 107/62 | HR 81 | Temp 98.5°F | Resp 16 | Ht 64.5 in | Wt 167.8 lb

## 2013-04-26 DIAGNOSIS — G902 Horner's syndrome: Secondary | ICD-10-CM

## 2013-04-26 DIAGNOSIS — L659 Nonscarring hair loss, unspecified: Secondary | ICD-10-CM

## 2013-04-26 DIAGNOSIS — J40 Bronchitis, not specified as acute or chronic: Secondary | ICD-10-CM

## 2013-04-26 MED ORDER — AZITHROMYCIN 250 MG PO TABS: ORAL_TABLET | ORAL | Status: DC

## 2013-04-26 NOTE — Progress Notes (Signed)
  Subjective:    Patient ID: Lindsay Valencia, female    DOB: 1975-10-15, 37 y.o.   MRN: [id number]  HPI 1.5 weeks of acute illness. Dragging on. Started out in chest. Tried mucinex. Son recently sick with fever. Feels worn down. Is not improving, starting to worsen again. Having coughing fits. Feels like there is congestion in chest. No fever at this time. Sinus congestion, runny nose. Cough is productive. No SOB. No pleuritic pain. Appetite decreased. No nausea or vomiting or diarrhea.   Also is having some hair loss. Has 39 year old. Was previously on lamictal and thought that this caused it. Has one little bald spot. Not itchy or painful.    Review of Systems  Constitutional: Positive for activity change, appetite change and fatigue. Negative for fever.  HENT: Positive for congestion and rhinorrhea.   Respiratory: Positive for cough. Negative for shortness of breath.   All other systems reviewed and are negative.      Objective:   Physical Exam  Constitutional: She is oriented to person, place, and time. She appears well-developed and well-nourished. No distress.  HENT:  Head: Normocephalic and atraumatic.  Right Ear: External ear normal.  Left Ear: External ear normal.  Mouth/Throat: Oropharynx is clear and moist. No oropharyngeal exudate.  Eyes: Right eye exhibits no discharge. Left eye exhibits no discharge. No scleral icterus.  Neck: Normal range of motion. Neck supple.  Cardiovascular: Normal rate, regular rhythm and normal heart sounds.   No murmur heard. Pulmonary/Chest: Effort normal and breath sounds normal. No respiratory distress. She has no wheezes. She has no rales.  Musculoskeletal: Normal range of motion.  Lymphadenopathy:    She has no cervical adenopathy.  Neurological: She is alert and oriented to person, place, and time.  Skin: Skin is warm and dry. She is not diaphoretic.  Psychiatric: She has a normal mood and affect. Her behavior is normal.  NOTE: Patient has  pupil asymmetry with known Horners Small patch of hair loss along part line with regrowth of new hairs, approx 3 mm long    Assessment & Plan:  #1. Bronchitis vs. Atypical Pneumonia - Z pack - Continue symptomatic care - return if onset of fever, worsening cough, SOB, not improving  #2. Hair loss - Evidence of new regrowth - Reassurance

## 2013-04-26 NOTE — Progress Notes (Signed)
Patient discussed with Dr. Voss. Agree with assessment and plan of care per her note.   

## 2013-04-26 NOTE — Patient Instructions (Signed)
Thank you for coming in today  Call if worsening fever, SOB, pleuritic chest pain Return Monday if no better  Bronchitis Bronchitis is the body's way of reacting to injury and/or infection (inflammation) of the bronchi. Bronchi are the air tubes that extend from the windpipe into the lungs. If the inflammation becomes severe, it may cause shortness of breath. CAUSES  Inflammation may be caused by:  A virus.  Germs (bacteria).  Dust.  Allergens.  Pollutants and many other irritants. The cells lining the bronchial tree are covered with tiny hairs (cilia). These constantly beat upward, away from the lungs, toward the mouth. This keeps the lungs free of pollutants. When these cells become too irritated and are unable to do their job, mucus begins to develop. This causes the characteristic cough of bronchitis. The cough clears the lungs when the cilia are unable to do their job. Without either of these protective mechanisms, the mucus would settle in the lungs. Then you would develop pneumonia. Smoking is a common cause of bronchitis and can contribute to pneumonia. Stopping this habit is the single most important thing you can do to help yourself. TREATMENT   Your caregiver may prescribe an antibiotic if the cough is caused by bacteria. Also, medicines that open up your airways make it easier to breathe. Your caregiver may also recommend or prescribe an expectorant. It will loosen the mucus to be coughed up. Only take over-the-counter or prescription medicines for pain, discomfort, or fever as directed by your caregiver.  Removing whatever causes the problem (smoking, for example) is critical to preventing the problem from getting worse.  Cough suppressants may be prescribed for relief of cough symptoms.  Inhaled medicines may be prescribed to help with symptoms now and to help prevent problems from returning.  For those with recurrent (chronic) bronchitis, there may be a need for steroid  medicines. SEEK IMMEDIATE MEDICAL CARE IF:   During treatment, you develop more pus-like mucus (purulent sputum).  You have a fever.  Your baby is older than 3 months with a rectal temperature of 102 F (38.9 C) or higher.  Your baby is 97 months old or younger with a rectal temperature of 100.4 F (38 C) or higher.  You become progressively more ill.  You have increased difficulty breathing, wheezing, or shortness of breath. It is necessary to seek immediate medical care if you are elderly or sick from any other disease. MAKE SURE YOU:   Understand these instructions.  Will watch your condition.  Will get help right away if you are not doing well or get worse. Document Released: 07/20/2005 Document Revised: 10/12/2011 Document Reviewed: 05/29/2008 [facility] Patient Information 2014 [place], [place].

## 2013-05-07 IMAGING — CR DG ABDOMEN 1V
1 series · 1 of 1 positions shown · non-contrast
Comparison: 11/29/2012.

CLINICAL DATA: Lithotripsy.  Kidney stones.  Pre lithotripsy
radiograph.

ABDOMEN - 1 VIEW

[t abdomen supine]
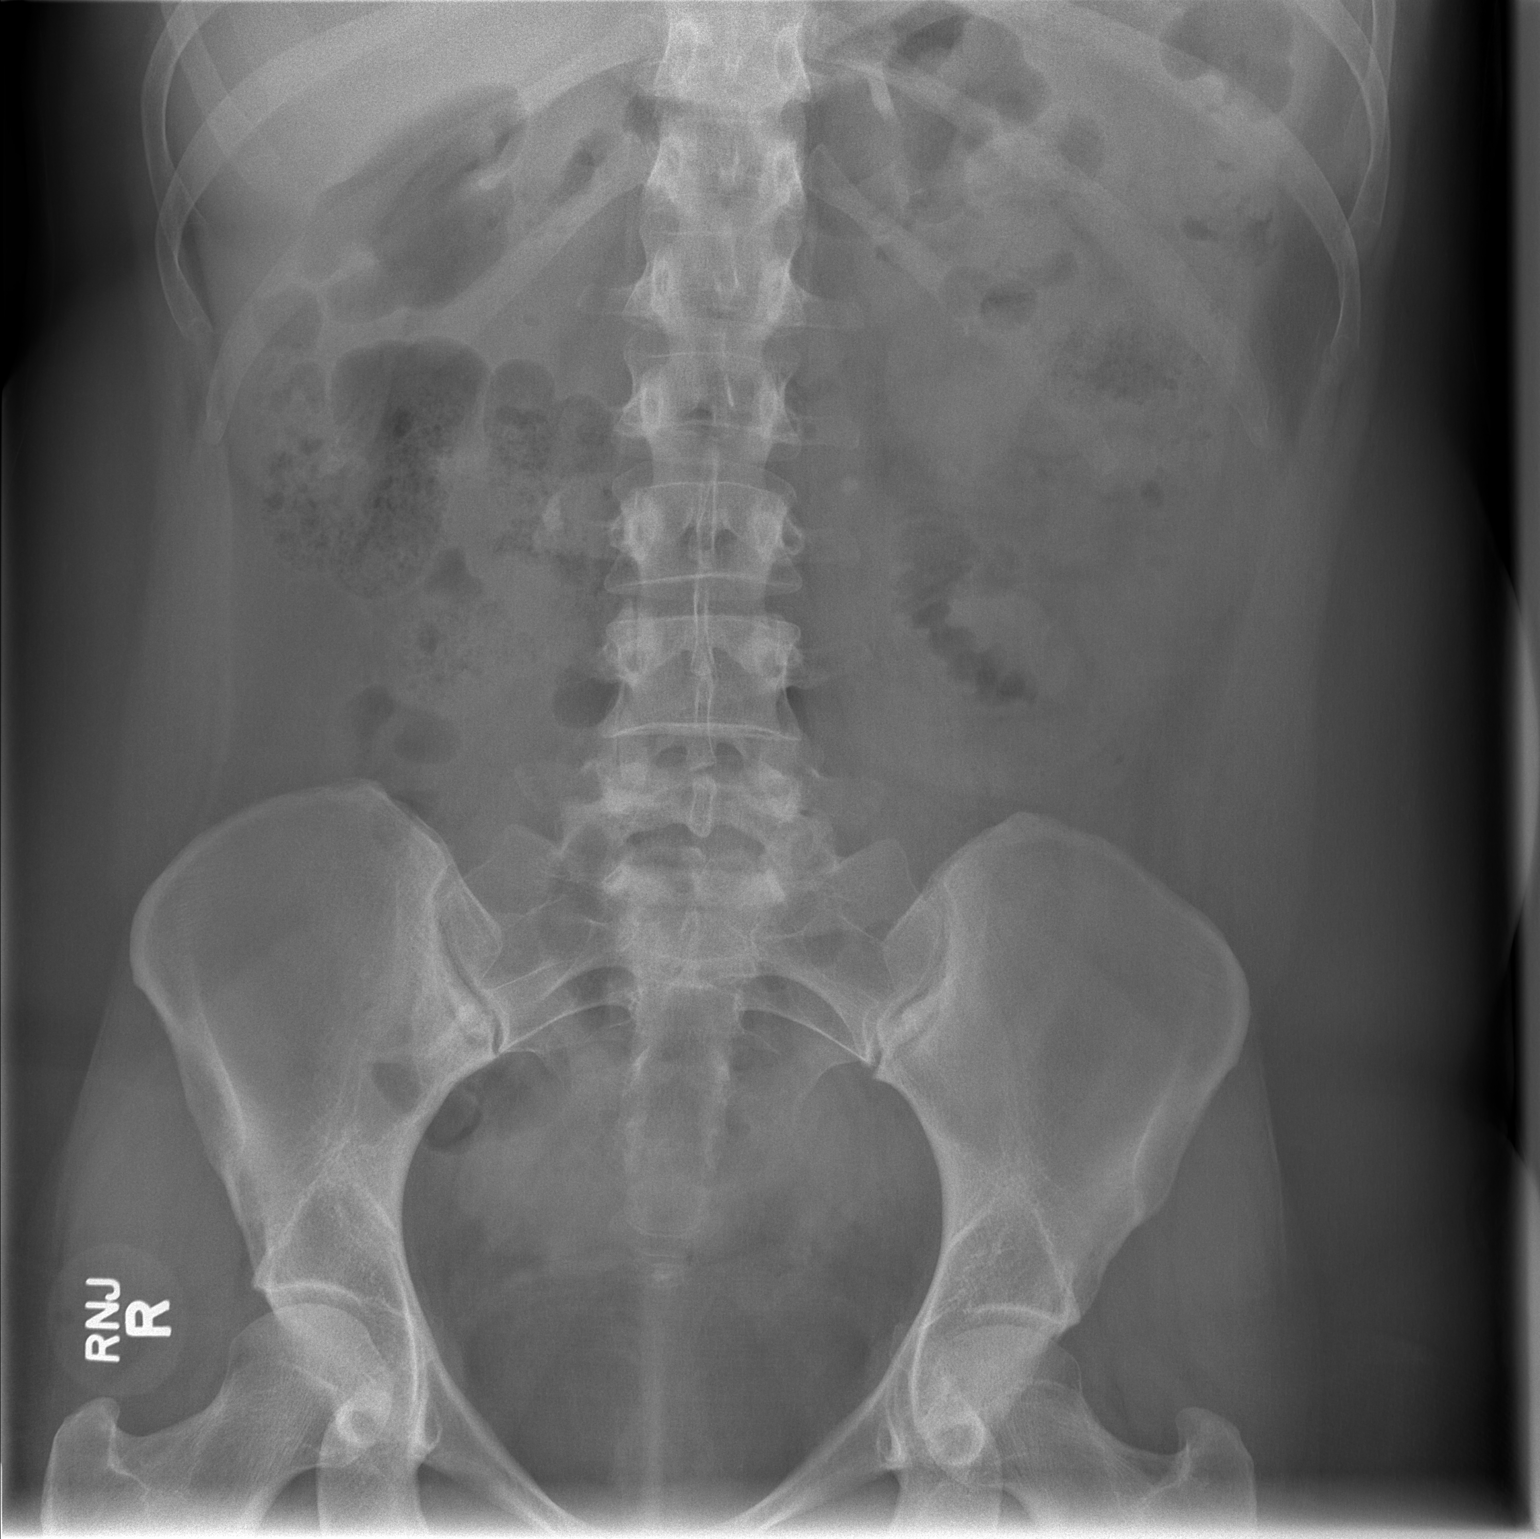

[1 of 1 positions shown; findings below may reference images not displayed]

FINDINGS: The right renal collecting system calculus has changed
position and an and likely migrated to the right UPJ.  This stone
measures 11 mm long axis.  This is immediately adjacent to the
right L3 transverse process.  The left renal calculus also appears
to migrated into the renal pelvis or UPJ.  This calculus measures 7
mm.  No distal ureteral calculi are identified.  Bowel gas pattern
is nonobstructive.
IMPRESSION: Migration of bilateral renal calculi, likely residing within the
renal pelvis or UPJ.

## 2014-09-18 ENCOUNTER — Ambulatory Visit (INDEPENDENT_AMBULATORY_CARE_PROVIDER_SITE_OTHER): Payer: BLUE CROSS/BLUE SHIELD | Admitting: Internal Medicine

## 2014-09-18 VITALS — BP 118/82 | HR 85 | Temp 97.9°F | Resp 19 | Ht 64.5 in | Wt 167.0 lb

## 2014-09-18 DIAGNOSIS — S41102A Unspecified open wound of left upper arm, initial encounter: Secondary | ICD-10-CM

## 2014-09-18 DIAGNOSIS — F429 Obsessive-compulsive disorder, unspecified: Secondary | ICD-10-CM | POA: Insufficient documentation

## 2014-09-18 DIAGNOSIS — F418 Other specified anxiety disorders: Secondary | ICD-10-CM

## 2014-09-18 NOTE — Progress Notes (Signed)
Procedure: Verbal consent obtained.  1% lidocaine with epi inserted directly at wound for anesthesia.  Laceration cleaned with soap and water.  No foreign body present with depth into adipose tissue, but musculature intact along this 6cm laceeration.  4 horizontal matress with on center simple interrupted suture all placed with 5-0 ethilon along the material.  Rinsed with normal saline.  Dressings applied.

## 2014-09-18 NOTE — Progress Notes (Signed)
° °  Subjective:  This chart was scribed for Tami Lin, MD by Donato Schultz, Medical Scribe. This patient was seen in Room 13 and the patient's care was started at 2:04 PM.   Patient ID: Lindsay Valencia, female    DOB: 12-17-1975, 39 y.o.   MRN: [id number]  HPI HPI Comments: Lindsay Valencia is a 39 y.o. female with a history of depression who presents to the [facility] complaining of a self-inflected laceration to her left arm that she did early this morning.  She sees a psychiatrist regularly.  Her doctors are in the process of adjusting her depression medications. There is no current suicidal ideation. There is a long history of self injury limited to this arm recently.  Past Medical History  Diagnosis Date   PP SVD 12/17/11 F 12/18/2011   Anxiety    Nephrolithiasis    Anemia    Past Surgical History  Procedure Laterality Date   Lithotripsy  2012   Family History  Problem Relation Age of Onset   Heart disease Maternal Grandmother     pacemaker   Hypertension Maternal Grandmother    Hyperlipidemia Mother    History   Social History   Marital Status: Married    Spouse Name: N/A   Number of Children: N/A   Years of Education: N/A   Occupational History   Not on file.   Social History Main Topics   Smoking status: Never Smoker    Smokeless tobacco: Never Used   Alcohol Use: No   Drug Use: No   Sexual Activity: Not on file   Other Topics Concern   Not on file   Social History Narrative   No Known Allergies  Review of Systems  Skin: Positive for wound.  Psychiatric/Behavioral: Positive for self-injury.  All other systems reviewed and are negative.   Objective:  Physical Exam  Constitutional: She is oriented to person, place, and time. She appears well-developed and well-nourished.  HENT:  Head: Normocephalic and atraumatic.  Eyes: EOM are normal.  Neck: Normal range of motion.  Cardiovascular: Normal rate.     Pulmonary/Chest: Effort normal.  Musculoskeletal: Normal range of motion.  Neurological: She is alert and oriented to person, place, and time.  Skin: Skin is warm and dry. Laceration noted.  On the left biceps area in a position already occupied by numerous linear scars, is a fresh laceration 7cm in length with a depth not penetrating into muscle tissue.    Psychiatric: She has a normal mood and affect. Her behavior is normal.  Nursing note and vitals reviewed.    BP 118/82 mmHg   Pulse 85   Temp(Src) 97.9 F (36.6 C) (Oral)   Resp 19   Ht 5' 4.5" (1.638 m)   Wt 167 lb (75.751 kg)   BMI 28.23 kg/m2   SpO2 99% Assessment & Plan:    I have completed the patient encounter in its entirety as documented by the scribe, with editing by me where necessary. Robert P. Laney Pastor, M.D. Problem #1 wound L arm  Following sutures she will perform wound care and return in 10 days for removal She will continue with her psychiatrist

## 2014-09-18 NOTE — Patient Instructions (Signed)

## 2015-03-27 ENCOUNTER — Other Ambulatory Visit: Payer: Self-pay | Admitting: Orthopedic Surgery

## 2015-04-04 DIAGNOSIS — G5601 Carpal tunnel syndrome, right upper limb: Secondary | ICD-10-CM

## 2015-04-04 HISTORY — DX: Carpal tunnel syndrome, right upper limb: G56.01

## 2015-04-18 ENCOUNTER — Encounter (HOSPITAL_BASED_OUTPATIENT_CLINIC_OR_DEPARTMENT_OTHER): Payer: Self-pay | Admitting: *Deleted

## 2015-04-24 ENCOUNTER — Other Ambulatory Visit: Payer: Self-pay | Admitting: Orthopedic Surgery

## 2015-04-25 ENCOUNTER — Ambulatory Visit (HOSPITAL_BASED_OUTPATIENT_CLINIC_OR_DEPARTMENT_OTHER): Payer: BLUE CROSS/BLUE SHIELD | Admitting: Anesthesiology

## 2015-04-25 ENCOUNTER — Ambulatory Visit (HOSPITAL_BASED_OUTPATIENT_CLINIC_OR_DEPARTMENT_OTHER)
Admission: RE | Admit: 2015-04-25 | Discharge: 2015-04-25 | Disposition: A | Discharge status: Home / Self Care | Payer: BLUE CROSS/BLUE SHIELD | Source: Ambulatory Visit | Attending: Orthopedic Surgery | Admitting: Orthopedic Surgery

## 2015-04-25 ENCOUNTER — Encounter (HOSPITAL_BASED_OUTPATIENT_CLINIC_OR_DEPARTMENT_OTHER)
Admission: RE | Disposition: A | Discharge status: Home / Self Care | Payer: Self-pay | Source: Ambulatory Visit | Attending: Orthopedic Surgery

## 2015-04-25 ENCOUNTER — Encounter (HOSPITAL_BASED_OUTPATIENT_CLINIC_OR_DEPARTMENT_OTHER): Payer: Self-pay | Admitting: *Deleted

## 2015-04-25 DIAGNOSIS — G5601 Carpal tunnel syndrome, right upper limb: Secondary | ICD-10-CM | POA: Diagnosis not present

## 2015-04-25 HISTORY — DX: Carpal tunnel syndrome, right upper limb: G56.01

## 2015-04-25 HISTORY — DX: Dental restoration status: Z98.811

## 2015-04-25 HISTORY — PX: CARPAL TUNNEL RELEASE: SHX101

## 2015-04-25 HISTORY — DX: Personal history of urinary calculi: Z87.442

## 2015-04-25 HISTORY — DX: Horner's syndrome: G90.2

## 2015-04-25 SURGERY — CARPAL TUNNEL RELEASE
Anesthesia: Monitor Anesthesia Care | Site: Wrist | Laterality: Right

## 2015-04-25 MED ORDER — LIDOCAINE HCL (CARDIAC) 20 MG/ML IV SOLN
INTRAVENOUS | Status: AC
  Filled 2015-04-25: qty 5

## 2015-04-25 MED ORDER — BUPIVACAINE HCL (PF) 0.25 % IJ SOLN
INTRAMUSCULAR | Status: DC | PRN
Start: 2015-04-25 — End: 2015-04-25
  Administered 2015-04-25: 4 mL

## 2015-04-25 MED ORDER — FENTANYL CITRATE (PF) 100 MCG/2ML IJ SOLN: 25.0000 ug | INTRAMUSCULAR | Status: DC | PRN

## 2015-04-25 MED ORDER — LACTATED RINGERS IV SOLN
INTRAVENOUS | Status: DC
  Administered 2015-04-25: 11:00:00 via INTRAVENOUS

## 2015-04-25 MED ORDER — CEFAZOLIN SODIUM-DEXTROSE 2-3 GM-% IV SOLR: 2.0000 g | INTRAVENOUS | Status: DC

## 2015-04-25 MED ORDER — PROPOFOL INFUSION 10 MG/ML OPTIME
INTRAVENOUS | Status: DC | PRN
  Administered 2015-04-25: 75 ug/kg/min via INTRAVENOUS

## 2015-04-25 MED ORDER — SCOPOLAMINE 1 MG/3DAYS TD PT72: 1.0000 | MEDICATED_PATCH | Freq: Once | TRANSDERMAL | Status: DC | PRN

## 2015-04-25 MED ORDER — CHLORHEXIDINE GLUCONATE 4 % EX LIQD: 60.0000 mL | Freq: Once | CUTANEOUS | Status: DC

## 2015-04-25 MED ORDER — CEFAZOLIN SODIUM-DEXTROSE 2-3 GM-% IV SOLR
INTRAVENOUS | Status: AC
  Filled 2015-04-25: qty 50

## 2015-04-25 MED ORDER — LIDOCAINE HCL (CARDIAC) 20 MG/ML IV SOLN
INTRAVENOUS | Status: DC | PRN
  Administered 2015-04-25: 50 mg via INTRAVENOUS

## 2015-04-25 MED ORDER — HYDROCODONE-ACETAMINOPHEN 5-325 MG PO TABS: 1.0000 | ORAL_TABLET | Freq: Four times a day (QID) | ORAL | Status: DC | PRN

## 2015-04-25 MED ORDER — PROPOFOL 10 MG/ML IV BOLUS
INTRAVENOUS | Status: AC
  Filled 2015-04-25: qty 20

## 2015-04-25 MED ORDER — MIDAZOLAM HCL 2 MG/2ML IJ SOLN
1.0000 mg | INTRAMUSCULAR | Status: DC | PRN
  Administered 2015-04-25: 1 mg via INTRAVENOUS

## 2015-04-25 MED ORDER — ONDANSETRON HCL 4 MG/2ML IJ SOLN: 4.0000 mg | Freq: Once | INTRAMUSCULAR | Status: DC | PRN

## 2015-04-25 MED ORDER — CEFAZOLIN SODIUM-DEXTROSE 2-3 GM-% IV SOLR
2.0000 g | INTRAVENOUS | Status: AC
  Administered 2015-04-25: 2 g via INTRAVENOUS

## 2015-04-25 MED ORDER — ONDANSETRON HCL 4 MG/2ML IJ SOLN
INTRAMUSCULAR | Status: AC
Start: 2015-04-25 — End: 2015-04-25
  Filled 2015-04-25: qty 2

## 2015-04-25 MED ORDER — GLYCOPYRROLATE 0.2 MG/ML IJ SOLN: 0.2000 mg | Freq: Once | INTRAMUSCULAR | Status: DC | PRN

## 2015-04-25 MED ORDER — MIDAZOLAM HCL 2 MG/2ML IJ SOLN
INTRAMUSCULAR | Status: AC
  Filled 2015-04-25: qty 4

## 2015-04-25 MED ORDER — FENTANYL CITRATE (PF) 100 MCG/2ML IJ SOLN
INTRAMUSCULAR | Status: AC
  Filled 2015-04-25: qty 4

## 2015-04-25 MED ORDER — FENTANYL CITRATE (PF) 100 MCG/2ML IJ SOLN
50.0000 ug | INTRAMUSCULAR | Status: DC | PRN
  Administered 2015-04-25: 50 ug via INTRAVENOUS

## 2015-04-25 MED ORDER — ONDANSETRON HCL 4 MG/2ML IJ SOLN
INTRAMUSCULAR | Status: DC | PRN
  Administered 2015-04-25: 4 mg via INTRAVENOUS

## 2015-04-25 SURGICAL SUPPLY — 36 items
BLADE SURG 15 STRL LF DISP TIS (BLADE) ×1 IMPLANT
BLADE SURG 15 STRL SS (BLADE) ×3
BNDG CMPR 9X4 STRL LF SNTH (GAUZE/BANDAGES/DRESSINGS)
BNDG COHESIVE 3X5 TAN STRL LF (GAUZE/BANDAGES/DRESSINGS) ×3 IMPLANT
BNDG ESMARK 4X9 LF (GAUZE/BANDAGES/DRESSINGS) IMPLANT
BNDG GAUZE ELAST 4 BULKY (GAUZE/BANDAGES/DRESSINGS) ×3 IMPLANT
CHLORAPREP W/TINT 26ML (MISCELLANEOUS) ×3 IMPLANT
CORDS BIPOLAR (ELECTRODE) ×3 IMPLANT
COVER BACK TABLE 60X90IN (DRAPES) ×3 IMPLANT
COVER MAYO STAND STRL (DRAPES) ×3 IMPLANT
CUFF TOURNIQUET SINGLE 18IN (TOURNIQUET CUFF) ×3 IMPLANT
DRAPE EXTREMITY T 121X128X90 (DRAPE) ×3 IMPLANT
DRAPE SURG 17X23 STRL (DRAPES) ×3 IMPLANT
DRSG PAD ABDOMINAL 8X10 ST (GAUZE/BANDAGES/DRESSINGS) ×3 IMPLANT
GAUZE SPONGE 4X4 12PLY STRL (GAUZE/BANDAGES/DRESSINGS) ×3 IMPLANT
GAUZE XEROFORM 1X8 LF (GAUZE/BANDAGES/DRESSINGS) ×3 IMPLANT
GLOVE BIO SURGEON STRL SZ 6.5 (GLOVE) ×2 IMPLANT
GLOVE BIO SURGEONS STRL SZ 6.5 (GLOVE) ×1
GLOVE BIOGEL PI IND STRL 7.0 (GLOVE) ×2 IMPLANT
GLOVE BIOGEL PI IND STRL 8.5 (GLOVE) ×1 IMPLANT
GLOVE BIOGEL PI INDICATOR 7.0 (GLOVE) ×4
GLOVE BIOGEL PI INDICATOR 8.5 (GLOVE) ×2
GLOVE SURG ORTHO 8.0 STRL STRW (GLOVE) ×3 IMPLANT
GOWN STRL REUS W/ TWL LRG LVL3 (GOWN DISPOSABLE) ×1 IMPLANT
GOWN STRL REUS W/TWL LRG LVL3 (GOWN DISPOSABLE) ×3
GOWN STRL REUS W/TWL XL LVL3 (GOWN DISPOSABLE) ×3 IMPLANT
NEEDLE PRECISIONGLIDE 27X1.5 (NEEDLE) ×3 IMPLANT
NS IRRIG 1000ML POUR BTL (IV SOLUTION) ×3 IMPLANT
PACK BASIN DAY SURGERY FS (CUSTOM PROCEDURE TRAY) ×3 IMPLANT
STOCKINETTE 4X48 STRL (DRAPES) ×3 IMPLANT
SUT ETHILON 4 0 PS 2 18 (SUTURE) ×3 IMPLANT
SUT VICRYL 4-0 PS2 18IN ABS (SUTURE) IMPLANT
SYR BULB 3OZ (MISCELLANEOUS) ×3 IMPLANT
SYR CONTROL 10ML LL (SYRINGE) ×3 IMPLANT
TOWEL OR 17X24 6PK STRL BLUE (TOWEL DISPOSABLE) ×3 IMPLANT
UNDERPAD 30X30 (UNDERPADS AND DIAPERS) ×3 IMPLANT

## 2015-04-25 NOTE — H&P (Signed)
Lindsay Valencia is a 39yo female.[name]  She was doing some work and has developed  pain, numbness and tingling in her fingertips, both sides. She has history of carpal tunnel syndrome, nerve conductions are positive.  She has not had any intervention with respect to this.  She has not taken anything for this at the present time.  It awakens her at night.  She has some discomfort on the dorsal aspect proximally of her right arm towards her elbow.    ALLERGIES:   She has no allergies.   MEDICATIONS:     Zoloft 75 and phentermine 18.5 mg..  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  FAMILY MEDICAL HISTORY:  Positive for high blood pressure and arthritis.  SOCIAL HISTORY:   She does not smoke or drink.  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    REVIEW OF SYSTEMS:    Positive for weight loss,  Horner's syndrome and depression, otherwise negative. Lindsay Valencia is an 39 y.o. female.   Chief Complaint: numbness bilateral hands HPI: see above  Past Medical History  Diagnosis Date  . Anxiety   . Horner's syndrome   . History of kidney stones   . Dental crowns present   . Carpal tunnel syndrome of right wrist 04/2015    Past Surgical History  Procedure Laterality Date  . Extracorporeal shock wave lithotripsy Right 12/05/2012  . Extracorporeal shock wave lithotripsy Left 09/29/2010    Family History  Problem Relation Age of Onset  . Heart disease Maternal Grandmother     pacemaker  . Hypertension Maternal Grandmother   . Hyperlipidemia Mother    Social History:  reports that she has never smoked. She has never used smokeless tobacco. She reports that she does not drink alcohol or use illicit drugs.  Allergies: No Known Allergies  Medications Prior to Admission  Medication Sig Dispense Refill  . buPROPion (WELLBUTRIN SR) 100 MG 12 hr tablet Take 50 mg by mouth daily.    . sertraline (ZOLOFT) 100 MG tablet Take 75 mg by mouth every evening.     [name]  levonorgestrel (MIRENA) 20 MCG/24HR IUD 1 each by Intrauterine route once.      No results found for this or any previous visit (from the past 48 hour(s)).  No results found.   Pertinent items are noted in HPI.  Blood pressure 136/97, pulse 78, temperature 98.6 F (37 C), temperature source Oral, resp. rate 20, height 5\' 4"  (1.626 m), weight 79.039 kg (174 lb 4 oz), SpO2 99 %.  General appearance: alert, cooperative and appears stated age Head: Normocephalic, without obvious abnormality Neck: no JVD Resp: clear to auscultation bilaterally Cardio: regular rate and rhythm, S1, S2 normal, no murmur, click, rub or gallop GI: soft, non-tender; bowel sounds normal; no masses,  no organomegaly Extremities: bilateral numb hands Pulses: 2+ and symmetric Skin: Skin color, texture, turgor normal. No rashes or lesions Neurologic: Grossly normal Incision/Wound: Small cut on thumb  Assessment/Plan RADIOGRAPHS:   X-rays of her cervical spine are essentially negative with minimal degenerative changes.   DX: bilateral car[pal tunnel syndrome  PLAN: We have discussed that there is no guarantee with the surgery, the possibility of infection, recurrence injury to arteries, nerves and tendons, incomplete relief of symptoms and dystrophy.  The pre, peri and postoperative course were discussed along with the risks and complications.    She would like to proceed to have the right carpal tunnel released.  This will be scheduled as an outpatient under regional anesthesia. She is seen with her husband.  KUZMA,GARY R 04/25/2015, 10:38 AM

## 2015-04-25 NOTE — Brief Op Note (Signed)
04/25/2015  12:23 PM  PATIENT:  Lindsay Valencia  39 y.o. female  PRE-OPERATIVE DIAGNOSIS:  right carpal tunnel syndrome   POST-OPERATIVE DIAGNOSIS:  right carpal tunnel syndrome  PROCEDURE:  Procedure(s): RIGHT CARPAL TUNNEL RELEASE (Right)  SURGEON:  Surgeon(s) and Role:    * Daryll Brod, MD - Primary  PHYSICIAN ASSISTANT:   ASSISTANTS: none   ANESTHESIA:   local and regional  EBL:     BLOOD ADMINISTERED:none  DRAINS: none   LOCAL MEDICATIONS USED:  BUPIVICAINE   SPECIMEN:  No Specimen  DISPOSITION OF SPECIMEN:  N/A  COUNTS:  YES  TOURNIQUET:   Total Tourniquet Time Documented: Forearm (Right) - 17 minutes Total: Forearm (Right) - 17 minutes   DICTATION: .Other Dictation: Dictation Number [id number]  PLAN OF CARE: Discharge to home after PACU  PATIENT DISPOSITION:  PACU - hemodynamically stable.

## 2015-04-25 NOTE — Anesthesia Postprocedure Evaluation (Signed)
  Anesthesia Post-op Note  Patient: Lindsay Valencia  Procedure(s) Performed: Procedure(s) (LRB): RIGHT CARPAL TUNNEL RELEASE (Right)  Patient Location: PACU  Anesthesia Type: MAC and Bier block  Level of Consciousness: awake and alert   Airway and Oxygen Therapy: Patient Spontanous Breathing  Post-op Pain: mild  Post-op Assessment: Post-op Vital signs reviewed, Patient's Cardiovascular Status Stable, Respiratory Function Stable, Patent Airway and No signs of Nausea or vomiting  Last Vitals:  Filed Vitals:   04/25/15 1222  BP: 141/86  Pulse: 60  Temp: 36.7 C  Resp: 20    Post-op Vital Signs: stable   Complications: No apparent anesthesia complications

## 2015-04-25 NOTE — Discharge Instructions (Addendum)
[facility] Instructions °Hand Surgery ° °Wound Care: °Keep your hand elevated above the level of your heart.  Do not allow it to dangle by your side.  Keep the dressing dry and do not remove it unless your doctor advises you to do so.  He will usually change it at the time of your post-op visit.  Moving your fingers is advised to stimulate circulation but will depend on the site of your surgery.  If you have a splint applied, your doctor will advise you regarding movement. ° °Activity: °Do not drive or operate machinery today.  Rest today and then you may return to your normal activity and work as indicated by your physician. ° °Diet:  °Drink liquids today or eat a light diet.  You may resume a regular diet tomorrow.   ° °General expectations: °Pain for two to three days. °Fingers may become slightly swollen. ° °Call your doctor if any of the following occur: °Severe pain not relieved by pain medication. °Elevated temperature. °Dressing soaked with blood. °Inability to move fingers. °White or bluish color to fingers. ° ° °Post Anesthesia Home Care Instructions ° °Activity: °Get plenty of rest for the remainder of the day. A responsible adult should stay with you for 24 hours following the procedure.  °For the next 24 hours, DO NOT: °-Drive a car °-Operate machinery °-Drink alcoholic beverages °-Take any medication unless instructed by your physician °-Make any legal decisions or sign important papers. ° °Meals: °Start with liquid foods such as gelatin or soup. Progress to regular foods as tolerated. Avoid greasy, spicy, heavy foods. If nausea and/or vomiting occur, drink only clear liquids until the nausea and/or vomiting subsides. Call your physician if vomiting continues. ° °Special Instructions/Symptoms: °Your throat may feel dry or sore from the anesthesia or the breathing tube placed in your throat during surgery. If this causes discomfort, gargle with warm salt water. The discomfort should disappear within 24  hours. ° °If you had a scopolamine patch placed behind your ear for the management of post- operative nausea and/or vomiting: ° °1. The medication in the patch is effective for 72 hours, after which it should be removed.  Wrap patch in a tissue and discard in the trash. Wash hands thoroughly with soap and water. °2. You may remove the patch earlier than 72 hours if you experience unpleasant side effects which may include dry mouth, dizziness or visual disturbances. °3. Avoid touching the patch. Wash your hands with soap and water after contact with the patch. °  °Call your surgeon if you experience:  ° °1.  Fever over 101.0. °2.  Inability to urinate. °3.  Nausea and/or vomiting. °4.  Extreme swelling or bruising at the surgical site. °5.  Continued bleeding from the incision. °6.  Increased pain, redness or drainage from the incision. °7.  Problems related to your pain medication. °8. Any change in color, movement and/or sensation °9. Any problems and/or concerns ° ° °

## 2015-04-25 NOTE — Op Note (Signed)
NAMEAMAYRANI, Lindsay Valencia              ACCOUNT NO.:  [id number]  MEDICAL RECORD NO.:  [id number]  LOCATION:                                 FACILITY:  PHYSICIAN:  Daryll Brod, M.D.            DATE OF BIRTH:  DATE OF PROCEDURE:  04/25/2015 DATE OF DISCHARGE:                              OPERATIVE REPORT   PREOPERATIVE DIAGNOSIS:  Carpal tunnel syndrome, right hand.  POSTOPERATIVE DIAGNOSIS:  Carpal tunnel syndrome, right hand.  OPERATION:  Decompression of right median nerve.  SURGEON:  Daryll Brod, MD.  ASSISTANT:  None.  ANESTHESIA:  Forearm-based IV regional with local infiltration.  HISTORY:  The patient is a 39 year old female with a history of bilateral carpal tunnel syndrome, nerve conduction is positive.  This has not responded to conservative treatment.  She has elected to undergo surgical decompression of median nerve, right wrist.  Pre, peri, and postoperative course have been discussed along with risks and complications.  She is aware that there is no guarantee with the surgery; possibility of infection; recurrence of injury to arteries, nerves, tendons; incomplete relief of symptoms; dystrophy.  She has a small neck laceration on the dorsal aspect of her right thumb.  I have cautioned her that this may increase her risk of infection, and I would recommend doing the opposite side.  She would like to have the right side done.  She was given a preoperative antibiotic.  PROCEDURE IN DETAIL:  The patient was brought to the operating room where a forearm-based IV regional anesthetic was carried out without difficulty.  She was prepped using ChloraPrep, supine position with the right arm free.  A 3-minute dry time was allowed.  Time-out taken, confirming the patient and procedure.  A longitudinal incision was made in the right palm, carried down through subcutaneous tissue.  Bleeders were electrocauterized with bipolar.  The palmar fascia was split. Superficial palmar arch  identified.  The flexor tendon to the ring and little finger identified to the ulnar side of the median nerve.  The carpal retinaculum was incised with sharp dissection.  Right angle Sewall retractor was placed between skin and forearm fascia.  The fascia released for approximately 2 cm proximal to the wrist crease under direct vision.  Canal was explored.  Air compression to the nerve was apparent.  The motor branch entered into muscle distally.  No further lesions were identified.  The wound was irrigated with saline and the skin closed with interrupted 4-0 nylon sutures.  Local infiltration with 0.25% bupivacaine without epinephrine was given, approximately 5-6 mL was used.  Sterile compressive dressing with the fingers free was applied.  On deflation of the tourniquet, all fingers immediately pinked.  She was taken to the recovery room for observation in satisfactory condition.  She will be discharged home to return to [place] in 1 week on Norco.          ______________________________ Daryll Brod, M.D.     GK/MEDQ  D:  04/25/2015  T:  04/25/2015  Job:  [id number]

## 2015-04-25 NOTE — Anesthesia Preprocedure Evaluation (Signed)
Anesthesia Evaluation  Patient identified by MRN, date of birth, ID band Patient awake    Reviewed: Allergy & Precautions, H&P , NPO status , Patient's Chart, lab work & pertinent test results  History of Anesthesia Complications Negative for: history of anesthetic complications  Airway Mallampati: II  TM Distance: >3 FB Neck ROM: full    Dental  (+) Teeth Intact Has dental crowns:   Pulmonary neg pulmonary ROS,    Pulmonary exam normal breath sounds clear to auscultation       Cardiovascular negative cardio ROS Normal cardiovascular exam Rhythm:regular Rate:Normal     Neuro/Psych Anxiety negative neurological ROS     GI/Hepatic negative GI ROS, Neg liver ROS,   Endo/Other  negative endocrine ROS  Renal/GU Renal disease     Musculoskeletal   Abdominal   Peds  Hematology negative hematology ROS (+)   Anesthesia Other Findings   Reproductive/Obstetrics negative OB ROS                             Anesthesia Physical Anesthesia Plan  ASA: II  Anesthesia Plan: MAC and Bier Block   Post-op Pain Management:    Induction: Intravenous  Airway Management Planned: Simple Face Mask  Additional Equipment:   Intra-op Plan:   Post-operative Plan:   Informed Consent: I have reviewed the patients History and Physical, chart, labs and discussed the procedure including the risks, benefits and alternatives for the proposed anesthesia with the patient or authorized representative who has indicated his/her understanding and acceptance.   Dental Advisory Given  Plan Discussed with: Anesthesiologist, CRNA and Surgeon  Anesthesia Plan Comments:         Anesthesia Quick Evaluation

## 2015-04-25 NOTE — Op Note (Signed)
Dictation Number [id number]

## 2015-04-25 NOTE — Transfer of Care (Signed)
Immediate Anesthesia Transfer of Care Note  Patient: Lindsay Valencia  Procedure(s) Performed: Procedure(s): RIGHT CARPAL TUNNEL RELEASE (Right)  Patient Location: PACU  Anesthesia Type:Bier block  Level of Consciousness: awake, sedated and patient cooperative  Airway & Oxygen Therapy: Patient Spontanous Breathing and Patient connected to face mask oxygen  Post-op Assessment: Report given to RN and Post -op Vital signs reviewed and stable  Post vital signs: Reviewed and stable  Last Vitals:  Filed Vitals:   04/25/15 1020  BP: 136/97  Pulse: 78  Temp: 37 C  Resp: 20    Complications: No apparent anesthesia complications

## 2015-04-26 ENCOUNTER — Encounter (HOSPITAL_BASED_OUTPATIENT_CLINIC_OR_DEPARTMENT_OTHER): Payer: Self-pay | Admitting: Orthopedic Surgery

## 2015-05-10 ENCOUNTER — Other Ambulatory Visit: Payer: Self-pay | Admitting: Orthopedic Surgery

## 2015-05-15 ENCOUNTER — Encounter (HOSPITAL_BASED_OUTPATIENT_CLINIC_OR_DEPARTMENT_OTHER): Payer: Self-pay | Admitting: *Deleted

## 2015-05-23 ENCOUNTER — Encounter (HOSPITAL_BASED_OUTPATIENT_CLINIC_OR_DEPARTMENT_OTHER): Payer: Self-pay | Admitting: Orthopedic Surgery

## 2015-05-23 ENCOUNTER — Encounter (HOSPITAL_BASED_OUTPATIENT_CLINIC_OR_DEPARTMENT_OTHER)
Admission: RE | Disposition: A | Discharge status: Home / Self Care | Payer: Self-pay | Source: Ambulatory Visit | Attending: Orthopedic Surgery

## 2015-05-23 ENCOUNTER — Ambulatory Visit (HOSPITAL_BASED_OUTPATIENT_CLINIC_OR_DEPARTMENT_OTHER)
Admission: RE | Admit: 2015-05-23 | Discharge: 2015-05-23 | Disposition: A | Discharge status: Home / Self Care | Payer: BLUE CROSS/BLUE SHIELD | Source: Ambulatory Visit | Attending: Orthopedic Surgery | Admitting: Orthopedic Surgery

## 2015-05-23 ENCOUNTER — Ambulatory Visit (HOSPITAL_BASED_OUTPATIENT_CLINIC_OR_DEPARTMENT_OTHER): Payer: BLUE CROSS/BLUE SHIELD | Admitting: Anesthesiology

## 2015-05-23 DIAGNOSIS — G5602 Carpal tunnel syndrome, left upper limb: Secondary | ICD-10-CM | POA: Insufficient documentation

## 2015-05-23 HISTORY — PX: CARPAL TUNNEL RELEASE: SHX101

## 2015-05-23 SURGERY — CARPAL TUNNEL RELEASE
Anesthesia: Monitor Anesthesia Care | Site: Wrist | Laterality: Left

## 2015-05-23 MED ORDER — FENTANYL CITRATE (PF) 100 MCG/2ML IJ SOLN
INTRAMUSCULAR | Status: AC
  Filled 2015-05-23: qty 4

## 2015-05-23 MED ORDER — BUPIVACAINE HCL (PF) 0.25 % IJ SOLN
INTRAMUSCULAR | Status: DC | PRN
  Administered 2015-05-23: 7 mL

## 2015-05-23 MED ORDER — LIDOCAINE HCL (PF) 0.5 % IJ SOLN
INTRAMUSCULAR | Status: DC | PRN
  Administered 2015-05-23: 30 mL via INTRAVENOUS

## 2015-05-23 MED ORDER — SCOPOLAMINE 1 MG/3DAYS TD PT72: 1.0000 | MEDICATED_PATCH | Freq: Once | TRANSDERMAL | Status: DC | PRN

## 2015-05-23 MED ORDER — FENTANYL CITRATE (PF) 100 MCG/2ML IJ SOLN
INTRAMUSCULAR | Status: DC | PRN
  Administered 2015-05-23: 50 ug via INTRAVENOUS

## 2015-05-23 MED ORDER — CEFAZOLIN SODIUM-DEXTROSE 2-3 GM-% IV SOLR
2.0000 g | INTRAVENOUS | Status: DC
Start: 2015-05-24 — End: 2015-05-23

## 2015-05-23 MED ORDER — PROPOFOL 10 MG/ML IV BOLUS
INTRAVENOUS | Status: DC | PRN
  Administered 2015-05-23: 30 mg via INTRAVENOUS

## 2015-05-23 MED ORDER — HYDROCODONE-ACETAMINOPHEN 5-325 MG PO TABS: 1.0000 | ORAL_TABLET | Freq: Four times a day (QID) | ORAL | Status: DC | PRN

## 2015-05-23 MED ORDER — CHLORHEXIDINE GLUCONATE 4 % EX LIQD: 60.0000 mL | Freq: Once | CUTANEOUS | Status: DC

## 2015-05-23 MED ORDER — CEFAZOLIN SODIUM-DEXTROSE 2-3 GM-% IV SOLR
2.0000 g | INTRAVENOUS | Status: AC
  Administered 2015-05-23: 2 g via INTRAVENOUS

## 2015-05-23 MED ORDER — GLYCOPYRROLATE 0.2 MG/ML IJ SOLN: 0.2000 mg | Freq: Once | INTRAMUSCULAR | Status: DC | PRN

## 2015-05-23 MED ORDER — CEFAZOLIN SODIUM-DEXTROSE 2-3 GM-% IV SOLR
INTRAVENOUS | Status: AC
  Filled 2015-05-23: qty 50

## 2015-05-23 MED ORDER — ONDANSETRON HCL 4 MG/2ML IJ SOLN
INTRAMUSCULAR | Status: DC | PRN
  Administered 2015-05-23: 4 mg via INTRAVENOUS

## 2015-05-23 MED ORDER — LIDOCAINE HCL (CARDIAC) 20 MG/ML IV SOLN
INTRAVENOUS | Status: DC | PRN
  Administered 2015-05-23: 20 mg via INTRAVENOUS

## 2015-05-23 MED ORDER — LACTATED RINGERS IV SOLN
INTRAVENOUS | Status: DC
  Administered 2015-05-23: 12:00:00 via INTRAVENOUS

## 2015-05-23 MED ORDER — MIDAZOLAM HCL 5 MG/5ML IJ SOLN
INTRAMUSCULAR | Status: DC | PRN
  Administered 2015-05-23: 2 mg via INTRAVENOUS

## 2015-05-23 MED ORDER — MIDAZOLAM HCL 2 MG/2ML IJ SOLN
INTRAMUSCULAR | Status: AC
  Filled 2015-05-23: qty 4

## 2015-05-23 SURGICAL SUPPLY — 35 items
BLADE SURG 15 STRL LF DISP TIS (BLADE) ×1 IMPLANT
BLADE SURG 15 STRL SS (BLADE) ×3
BNDG CMPR 9X4 STRL LF SNTH (GAUZE/BANDAGES/DRESSINGS)
BNDG COHESIVE 3X5 TAN STRL LF (GAUZE/BANDAGES/DRESSINGS) ×3 IMPLANT
BNDG ESMARK 4X9 LF (GAUZE/BANDAGES/DRESSINGS) IMPLANT
BNDG GAUZE ELAST 4 BULKY (GAUZE/BANDAGES/DRESSINGS) ×3 IMPLANT
CHLORAPREP W/TINT 26ML (MISCELLANEOUS) ×3 IMPLANT
CORDS BIPOLAR (ELECTRODE) ×3 IMPLANT
COVER BACK TABLE 60X90IN (DRAPES) ×3 IMPLANT
COVER MAYO STAND STRL (DRAPES) ×3 IMPLANT
CUFF TOURNIQUET SINGLE 18IN (TOURNIQUET CUFF) ×3 IMPLANT
DRAPE EXTREMITY T 121X128X90 (DRAPE) ×3 IMPLANT
DRAPE SURG 17X23 STRL (DRAPES) ×3 IMPLANT
DRSG PAD ABDOMINAL 8X10 ST (GAUZE/BANDAGES/DRESSINGS) ×3 IMPLANT
GAUZE SPONGE 4X4 12PLY STRL (GAUZE/BANDAGES/DRESSINGS) ×3 IMPLANT
GAUZE XEROFORM 1X8 LF (GAUZE/BANDAGES/DRESSINGS) ×3 IMPLANT
GLOVE BIOGEL PI IND STRL 7.0 (GLOVE) ×2 IMPLANT
GLOVE BIOGEL PI IND STRL 8.5 (GLOVE) ×1 IMPLANT
GLOVE BIOGEL PI INDICATOR 7.0 (GLOVE) ×4
GLOVE BIOGEL PI INDICATOR 8.5 (GLOVE) ×2
GLOVE ECLIPSE 6.5 STRL STRAW (GLOVE) ×3 IMPLANT
GLOVE SURG ORTHO 8.0 STRL STRW (GLOVE) ×3 IMPLANT
GOWN STRL REUS W/ TWL LRG LVL3 (GOWN DISPOSABLE) ×1 IMPLANT
GOWN STRL REUS W/TWL LRG LVL3 (GOWN DISPOSABLE) ×3
GOWN STRL REUS W/TWL XL LVL3 (GOWN DISPOSABLE) ×3 IMPLANT
NEEDLE PRECISIONGLIDE 27X1.5 (NEEDLE) ×3 IMPLANT
NS IRRIG 1000ML POUR BTL (IV SOLUTION) ×3 IMPLANT
PACK BASIN DAY SURGERY FS (CUSTOM PROCEDURE TRAY) ×3 IMPLANT
STOCKINETTE 4X48 STRL (DRAPES) ×3 IMPLANT
SUT ETHILON 4 0 PS 2 18 (SUTURE) ×3 IMPLANT
SUT VICRYL 4-0 PS2 18IN ABS (SUTURE) IMPLANT
SYR BULB 3OZ (MISCELLANEOUS) ×3 IMPLANT
SYR CONTROL 10ML LL (SYRINGE) ×3 IMPLANT
TOWEL OR 17X24 6PK STRL BLUE (TOWEL DISPOSABLE) ×3 IMPLANT
UNDERPAD 30X30 (UNDERPADS AND DIAPERS) IMPLANT

## 2015-05-23 NOTE — Discharge Instructions (Addendum)

## 2015-05-23 NOTE — Op Note (Signed)
Dictation Number [id number]

## 2015-05-23 NOTE — Brief Op Note (Signed)
05/23/2015  1:06 PM  PATIENT:  Lindsay Valencia  39 y.o. female  PRE-OPERATIVE DIAGNOSIS:  LEFT CARPAL TUNNEL SYNDROME  POST-OPERATIVE DIAGNOSIS:  LEFT CARPAL TUNNEL SYNDROME  PROCEDURE:  Procedure(s): LEFT CARPAL TUNNEL RELEASE (Left)  SURGEON:  Surgeon(s) and Role:    * Daryll Brod, MD - Primary  PHYSICIAN ASSISTANT:   ASSISTANTS: none   ANESTHESIA:   local and regional  EBL:  Total I/O In: 500 [I.V.:500] Out: -   BLOOD ADMINISTERED:none  DRAINS: none   LOCAL MEDICATIONS USED:  BUPIVICAINE   SPECIMEN:  No Specimen  DISPOSITION OF SPECIMEN:  N/A  COUNTS:  YES  TOURNIQUET:   Total Tourniquet Time Documented: Forearm (Left) - 17 minutes Total: Forearm (Left) - 17 minutes   DICTATION: .Other Dictation: Dictation Number [id number]  PLAN OF CARE: Discharge to home after PACU  PATIENT DISPOSITION:  PACU - hemodynamically stable.

## 2015-05-23 NOTE — Transfer of Care (Signed)
Immediate Anesthesia Transfer of Care Note  Patient: Lindsay Valencia  Procedure(s) Performed: Procedure(s): LEFT CARPAL TUNNEL RELEASE (Left)  Patient Location: PACU  Anesthesia Type:Bier block  Level of Consciousness: awake, alert , oriented and patient cooperative  Airway & Oxygen Therapy: Patient Spontanous Breathing  Post-op Assessment: Report given to RN and Post -op Vital signs reviewed and stable  Post vital signs: Reviewed and stable  Last Vitals:  Filed Vitals:   05/23/15 1304  BP:   Pulse: 72  Temp:   Resp: 13    Complications: No apparent anesthesia complications

## 2015-05-23 NOTE — Anesthesia Postprocedure Evaluation (Signed)
Anesthesia Post Note  Patient: Lindsay Valencia  Procedure(s) Performed: Procedure(s) (LRB): LEFT CARPAL TUNNEL RELEASE (Left)  Anesthesia type: MAC  Patient location: PACU  Post pain: Pain level controlled  Post assessment: Patient's Cardiovascular Status Stable  Last Vitals:  Filed Vitals:   05/23/15 1315  BP: 133/89  Pulse: 65  Temp:   Resp: 18    Post vital signs: Reviewed and stable  Level of consciousness: sedated  Complications: No apparent anesthesia complications

## 2015-05-23 NOTE — Anesthesia Preprocedure Evaluation (Signed)
Anesthesia Evaluation  Patient identified by MRN, date of birth, ID band Patient awake    Reviewed: Allergy & Precautions, H&P , NPO status , Patient's Chart, lab work & pertinent test results  History of Anesthesia Complications Negative for: history of anesthetic complications  Airway Mallampati: II  TM Distance: >3 FB Neck ROM: full    Dental  (+) Teeth Intact Has dental crowns:   Pulmonary neg pulmonary ROS,    Pulmonary exam normal breath sounds clear to auscultation       Cardiovascular negative cardio ROS Normal cardiovascular exam Rhythm:regular Rate:Normal     Neuro/Psych PSYCHIATRIC DISORDERS Anxiety negative neurological ROS     GI/Hepatic negative GI ROS, Neg liver ROS,   Endo/Other  negative endocrine ROS  Renal/GU Renal disease     Musculoskeletal   Abdominal   Peds  Hematology negative hematology ROS (+)   Anesthesia Other Findings   Reproductive/Obstetrics negative OB ROS                             Anesthesia Physical  Anesthesia Plan  ASA: II  Anesthesia Plan: MAC and Bier Block   Post-op Pain Management:    Induction: Intravenous  Airway Management Planned: Simple Face Mask  Additional Equipment:   Intra-op Plan:   Post-operative Plan:   Informed Consent: I have reviewed the patients History and Physical, chart, labs and discussed the procedure including the risks, benefits and alternatives for the proposed anesthesia with the patient or authorized representative who has indicated his/her understanding and acceptance.   Dental Advisory Given  Plan Discussed with: Anesthesiologist, CRNA and Surgeon  Anesthesia Plan Comments:         Anesthesia Quick Evaluation

## 2015-05-23 NOTE — Op Note (Signed)
NAMEGENEIVE, SANDSTROM NO.:  [id number]  MEDICAL RECORD NO.:  [id number]  LOCATION:                                 FACILITY:  PHYSICIAN:  Daryll Brod, M.D.            DATE OF BIRTH:  DATE OF PROCEDURE:  05/23/2015 DATE OF DISCHARGE:                              OPERATIVE REPORT   PREOPERATIVE DIAGNOSIS:  Carpal tunnel syndrome, left hand.  POSTOPERATIVE DIAGNOSIS:  Carpal tunnel syndrome, left hand.  OPERATION:  Decompression of left median nerve.  SURGEON:  Daryll Brod, MD.  ANESTHESIA:  Forearm-based IV regional with local infiltration.  ANESTHESIOLOGIST:  Nelda Severe. Tobias Alexander, M.D.  HISTORY:  The patient is a 39 year old female with a history of bilateral carpal tunnel syndrome who has undergone release on her right side, and is admitted now for her left.  Nerve conductions are positive. This has not responded to conservative treatment.  Pre, peri, and postoperative course have been discussed along with risks and complications.  She is aware there is no guarantee with surgery; possibility of infection; recurrence of injury to arteries, nerves, tendons; incomplete relief of symptoms; dystrophy.  In the preoperative area, the patient was seen, the extremity marked by both patient and surgeon.  Antibiotic given.  PROCEDURE IN DETAIL:  The patient was brought to the operating room, where forearm-based IV regional anesthetic was carried out without difficulty.  She was prepped using ChloraPrep in supine position with the left arm free.  A 3-minute dry time was allowed.  Time-out taken, confirming the patient and procedure.  The longitudinal incision was made in the left palm, carried down through subcutaneous tissue. Bleeders were electrocauterized.  Palmar fascia was split.  Superficial palmar arch was identified.  The flexor tendon to the ring and little finger was identified to the ulnar side of the median nerve.  The carpal retinaculum was incised with sharp  dissection.  Right angle and Sewall retractor were placed between skin and forearm fascia.  The fascia was released for approximately a centimeter and half proximal to the wrist crease under direct vision.  Canal was explored.  Air compression to the nerve was apparent.  No further lesions were identified.  Motor branch entered into muscle.  Wound was copiously irrigated with saline and the skin was closed with interrupted 4-0 nylon sutures.  Local infiltration with 0.25% bupivacaine without epinephrine was given, approximately 7.5 mL was used.  Sterile compressive dressing with the fingers free was applied.  On deflation of the tourniquet, all fingers immediately pinked.  She was taken to the recovery room for observation in satisfactory condition.  She will be discharged home to return to the [place] in 1 week, on Norco.          ______________________________ Daryll Brod, M.D.     GK/MEDQ  D:  05/23/2015  T:  05/23/2015  Job:  [id number]

## 2015-05-23 NOTE — Anesthesia Procedure Notes (Signed)
Anesthesia Regional Block:  Bier block (IV Regional)  Pre-Anesthetic Checklist: ,, timeout performed, Correct Patient, Correct Site, Correct Laterality, Correct Procedure,, site marked, surgical consent,, at surgeon's request Needles:  Injection technique: Single-shot  Needle Type: Other      Needle Gauge: 20 and 20 G    Additional Needles: Bier block (IV Regional) Narrative:   Performed by: Personally    Procedure Name: MAC Date/Time: 05/23/2015 12:40 PM Performed by: Habib Kise D Pre-anesthesia Checklist: Patient identified, Emergency Drugs available, Suction available, Patient being monitored and Timeout performed Patient Re-evaluated:Patient Re-evaluated prior to inductionOxygen Delivery Method: Simple face mask

## 2015-05-23 NOTE — H&P (Signed)
  Denitra Donaghey is 39 years old.  She was doing some work and has developed  pain, numbness and tingling in her fingertips, both sides. She has history of carpal tunnel syndrome, nerve conductions are positive.  She has not had any intervention with respect to this.  She has not taken anything for this at the present time.  It awakens her at night.  She has some discomfort on the dorsal aspect proximally of her right arm towards her elbow.  She has had her right carpal tunnel released and is doin well.  ALLERGIES:   She has no allergies.   MEDICATIONS:     Zoloft 75 and phentermine 18.5 mg..  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  FAMILY MEDICAL HISTORY:  Positive for high blood pressure and arthritis.  SOCIAL HISTORY:   She does not smoke or drink.  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    REVIEW OF SYSTEMS:    Positive for weight loss,  Horner's syndrome and depression, otherwise negative. ZIMAL WEISENSEL is an 39 y.o. female.   Chief Complaint: CTS left HPI: see above  Past Medical History  Diagnosis Date  . Anxiety   . Horner's syndrome   . History of kidney stones   . Dental crowns present   . Carpal tunnel syndrome of right wrist 04/2015    Past Surgical History  Procedure Laterality Date  . Extracorporeal shock wave lithotripsy Right 12/05/2012  . Extracorporeal shock wave lithotripsy Left 09/29/2010  . Carpal tunnel release Right 04/25/2015    Procedure: RIGHT CARPAL TUNNEL RELEASE;  Surgeon: Daryll Brod, MD;  Location: [facility];  Service: Orthopedics;  Laterality: Right;    Family History  Problem Relation Age of Onset  . Heart disease Maternal Grandmother     pacemaker  . Hypertension Maternal Grandmother   . Hyperlipidemia Mother    Social History:  reports that she has never smoked. She has never used smokeless tobacco. She reports that she does not drink alcohol or use illicit drugs.  Allergies: No Known  Allergies  No prescriptions prior to admission    No results found for this or any previous visit (from the past 48 hour(s)).  No results found.   Pertinent items are noted in HPI.  Height 5\' 4"  (1.626 m), weight 78.926 kg (174 lb).  General appearance: alert, cooperative and appears stated age Head: Normocephalic, without obvious abnormality Neck: no JVD Resp: clear to auscultation bilaterally Cardio: regular rate and rhythm, S1, S2 normal, no murmur, click, rub or gallop GI: soft, non-tender; bowel sounds normal; no masses,  no organomegaly Extremities: numbness left hand Pulses: 2+ and symmetric Skin: Skin color, texture, turgor normal. No rashes or lesions Neurologic: Grossly normal Incision/Wound: na  Assessment/Plan Carpal tunnel left Plan release left carpal tunnel  Robel Wuertz R 05/23/2015, 11:19 AM

## 2015-05-24 ENCOUNTER — Encounter (HOSPITAL_BASED_OUTPATIENT_CLINIC_OR_DEPARTMENT_OTHER): Payer: Self-pay | Admitting: Orthopedic Surgery

## 2015-06-21 DIAGNOSIS — G5602 Carpal tunnel syndrome, left upper limb: Secondary | ICD-10-CM | POA: Insufficient documentation

## 2015-06-21 DIAGNOSIS — G5601 Carpal tunnel syndrome, right upper limb: Secondary | ICD-10-CM | POA: Insufficient documentation

## 2015-11-25 ENCOUNTER — Ambulatory Visit (INDEPENDENT_AMBULATORY_CARE_PROVIDER_SITE_OTHER): Payer: BLUE CROSS/BLUE SHIELD | Admitting: Physician Assistant

## 2015-11-25 ENCOUNTER — Ambulatory Visit (INDEPENDENT_AMBULATORY_CARE_PROVIDER_SITE_OTHER): Payer: BLUE CROSS/BLUE SHIELD

## 2015-11-25 VITALS — BP 118/76 | HR 93 | Temp 98.1°F | Resp 15 | Ht 65.0 in | Wt 157.8 lb

## 2015-11-25 DIAGNOSIS — L237 Allergic contact dermatitis due to plants, except food: Secondary | ICD-10-CM | POA: Diagnosis not present

## 2015-11-25 DIAGNOSIS — F418 Other specified anxiety disorders: Secondary | ICD-10-CM

## 2015-11-25 DIAGNOSIS — R319 Hematuria, unspecified: Secondary | ICD-10-CM | POA: Diagnosis not present

## 2015-11-25 LAB — POC MICROSCOPIC URINALYSIS (UMFC): MUCUS RE: ABSENT

## 2015-11-25 LAB — POCT URINALYSIS DIP (MANUAL ENTRY)
BILIRUBIN UA: NEGATIVE
GLUCOSE UA: NEGATIVE
NITRITE UA: NEGATIVE
Protein Ur, POC: 100 — AB
SPEC GRAV UA: 1.015
UROBILINOGEN UA: 1
pH, UA: 7

## 2015-11-25 MED ORDER — PREDNISONE 20 MG PO TABS: ORAL_TABLET | ORAL | Status: DC

## 2015-11-25 NOTE — Progress Notes (Signed)
Subjective:   Patient ID: Lindsay Valencia, female     DOB: [birth year], 40 y.o.    MRN: [id number]  PCP: Lovenia Kim, MD  Chief Complaint  Patient presents with  . Hematuria    hx of kidney stones x 5 days ago   . Rash    rash around right eye and arms, expose to poison ivy x 3 days ago     HPI  Presents for evaluation of hematuria for the past 5 days along with rash for the past 3 days.  Patient has history of kidney stones. She is followed by urology and has an appointment there tomorrow. Her last kidney stone was approx 5 years ago. She has had several lithotripsy procedures. Patient noticed hematuria approx 5 days ago. She denies any fever, chill, N/V/D, urgency, frequency, dysuria, or vaginal discharge. Patient denies any flank of abd pain and states "she usually does not get pain with her kidney stone."   [place] and approx 3 days ago she noticed vesicular lesions appear on her arms and legs. She has had poison ivy in the past and states this looks similar. She has since noticed swelling and itching of the skin surrounding her right eye. She has tried topical hydrocortisone with little relief. She also takes benadryl at night to help with the itching and allow her to sleep. She was worried because of the area around her right eye and wanted to get it examined. She denies any visual disturbances. 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  .  Prior to Admission medications   Medication Sig Start Date End Date Taking? Authorizing Provider  buPROPion (WELLBUTRIN SR) 100 MG 12 hr tablet Take 50 mg by mouth daily.   Yes Historical Provider, MD  levonorgestrel (MIRENA) 20 MCG/24HR IUD 1 each by Intrauterine route once.   Yes Historical Provider, MD  phentermine 15 MG capsule Take 18.5 mg by mouth.   Yes Historical Provider, MD  sertraline (ZOLOFT) 100 MG tablet Take 75 mg by mouth every evening.    Yes Historical Provider, MD    ALPRAZolam (XANAX) 0.5 MG tablet TK 1/2-1 T UP TO QID PRF AGITATION 09/18/15   Historical Provider, MD  buPROPion (WELLBUTRIN SR) 150 MG 12 hr tablet TK 1 TABLET BY MOUTH Q AM 11/02/15   Historical Provider, MD     No Known Allergies   Patient Active Problem List   Diagnosis Date Noted  . 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  . Carpal tunnel syndrome of right wrist 06/21/2015  . 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 PP SVD 12/17/11 F 12/18/2011     Family History  Problem Relation Age of Onset  . Heart disease Maternal Grandmother     pacemaker  . Hypertension Maternal Grandmother   . Hyperlipidemia Mother      Social History   Social History  . Marital Status: Married    Spouse Name: N/A  . Number of Children: N/A  . Years of Education: N/A   Occupational History  . Not on file.   Social History Main Topics  . Smoking status: Never Smoker   . Smokeless tobacco: Never Used  . Alcohol Use: No  . Drug Use: No  . Sexual Activity: Not on file   Other Topics Concern  . Not on file   Social History Narrative        Review of  Systems Constitutional: Negative for fever, chills and fatigue.  HENT: Negative.  Eyes: Positive for redness and itching. Negative for visual disturbance.  Respiratory: Negative for shortness of breath and wheezing.  Cardiovascular: Negative for chest pain.  Gastrointestinal: Negative for nausea, vomiting, abdominal pain and diarrhea.  Genitourinary: Positive for hematuria. Negative for dysuria, urgency, frequency, flank pain, decreased urine volume and pelvic pain.  Skin: Positive for rash.       Objective:  Physical Exam  Constitutional: She is oriented to person, place, and time. She appears well-developed and well-nourished. She is active and cooperative. No distress.  BP 118/76 mmHg  Pulse 93  Temp(Src) 98.1 F (36.7 C) (Oral)  Resp 15  Ht 5\' 5"  (1.651 m)  Wt 157 lb 12.8 oz  (71.578 kg)  BMI 26.26 kg/m2  SpO2 98%   Eyes: Conjunctivae are normal. Right pupil is not round (Horner's pupil).  Pulmonary/Chest: Effort normal.  Abdominal: There is no CVA tenderness.  Musculoskeletal:       Thoracic back: Normal.       Lumbar back: Normal.  Neurological: She is alert and oriented to person, place, and time.  Skin: Skin is warm and dry. Rash noted. Rash is vesicular (Lateral to the RIGHT eye, LEFT cheek, forearms, legs, trunk).     Psychiatric: She has a normal mood and affect. Her speech is normal and behavior is normal.        Results for orders placed or performed in visit on 11/25/15  POCT urinalysis dipstick  Result Value Ref Range   Color, UA brown (A) yellow   Clarity, UA cloudy (A) clear   Glucose, UA negative negative   Bilirubin, UA small (A) negative   Ketones, POC UA negative negative   Spec Grav, UA 1.015    Blood, UA large (A) negative   pH, UA 7.0    Protein Ur, POC =100 (A) negative   Urobilinogen, UA 1.0    Nitrite, UA Negative Negative   Leukocytes, UA small (1+) (A) Negative  POCT Microscopic Urinalysis ([facility])  Result Value Ref Range   WBC,UR,HPF,POC Few (A) None WBC/hpf   RBC,UR,HPF,POC Too numerous to count  (A) None RBC/hpf   Bacteria None None, Too numerous to count   Mucus Absent Absent   Epithelial Cells, UR Per Microscopy Few (A) None, Too numerous to count cells/hpf    Dg Abd 1 View  11/25/2015  CLINICAL DATA:  Gross hematuria 6 days ago EXAM: ABDOMEN - 1 VIEW COMPARISON:  12/16/2012 FINDINGS: There are few small calcific densities over the right renal shadow. There is also an approximately 5 mm calcific density to the right of the L2-3 disc space which could be within the renal pelvis. It could also be within bowel. There is an intrauterine device. There is stool throughout the colon. IMPRESSION: Calcific densities right renal fossa possibly representing calculi Electronically Signed   By: Skipper Cliche M.D.   On:  11/25/2015 11:17        Assessment & Plan:  1. Blood in urine 5 mm density lateral to L2-3 on the RIGHT, which may represent a stone in the RIGHT renal pelvis. As she is not having any pain, urgency or frequency, I recommend that she contact her urologist, who may want to move her appointment out to see if she can pass this stone. Hydrate. - POCT urinalysis dipstick - POCT Microscopic Urinalysis ([facility]) - DG Abd 1 View; Future  2. Poison ivy Supportive care. Anticipatory guidance. - predniSONE (  DELTASONE) 20 MG tablet; Take 3 PO QAM x3days, 2 PO QAM x3days, 1 PO QAM x3days  Dispense: 18 tablet; Refill: 0   Fara Chute, PA-C Physician Assistant-Certified [facility]

## 2015-11-25 NOTE — Patient Instructions (Signed)
     IF you received an x-ray today, you will receive an invoice from [facility]. Please contact  Radiology at [phone] with questions or concerns regarding your invoice.   IF you received labwork today, you will receive an invoice from [facility]. Please contact [facility] at [phone] with questions or concerns regarding your invoice.   Our billing staff will not be able to assist you with questions regarding bills from these companies.  You will be contacted with the lab results as soon as they are available. The fastest way to get your results is to activate your My Chart account. Instructions are located on the last page of this paperwork. If you have not heard from us regarding the results in 2 weeks, please contact this office.      

## 2015-11-25 NOTE — Progress Notes (Signed)
Subjective:    Patient ID: Lindsay Valencia, female    DOB: [birth year], 40 y.o.   MRN: [id number]  Chief Complaint  Patient presents with  . Hematuria    hx of kidney stones x 5 days ago   . Rash    rash around right eye and arms, expose to poison ivy x 3 days ago     HPI  Patient presents with history of Horner syndrome, depression, anxiety, and kidney stone today for hematuria for the past 5 days along with rash for the past 3 days.  Patient has history of kidney stones. She is followed by urology and couldn't get in till tomorrow. Her last kidney stone was approx 5 years ago. She has had several lithotripsy. Patient noticed hematuria approx 5 days ago. She denies any fever, chill, N/V/D, urgency, frequency, dysuria, or vaginal discharge. Patient denies any flank of abd pain and states "she usually does not get pain with her kidney stone."   [place] and approx 3 days ago she noticed vesicular lesions appear on her arms and legs. She has had poison ivy in the past and states this looks similar. She has since notice swelling and itching of her right eye. She has tried topical hydrocortisone with little relief. She also takes benadryl at night to help with the itching and sleep. She was worried because of the area around her right eye and wanted to get it examined. She denies any visual disturbances. 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  Past Medical History  Diagnosis Date  . Anxiety   . Horner's syndrome   . History of kidney stones   . Dental crowns present   . Carpal tunnel syndrome of right wrist 04/2015     Current Outpatient Prescriptions on File Prior to Visit  Medication Sig Dispense Refill  . buPROPion (WELLBUTRIN SR) 100 MG 12 hr tablet Take 50 mg by mouth daily.    [name] levonorgestrel (MIRENA) 20 MCG/24HR IUD 1 each by Intrauterine route once.    . sertraline (ZOLOFT) 100 MG tablet Take 75 mg by mouth every evening.      No  current facility-administered medications on file prior to visit.   No Known Allergies    Review of Systems  Constitutional: Negative for fever, chills and fatigue.  HENT: Negative.   Eyes: Positive for redness and itching. Negative for visual disturbance.  Respiratory: Negative for shortness of breath and wheezing.   Cardiovascular: Negative for chest pain.  Gastrointestinal: Negative for nausea, vomiting, abdominal pain and diarrhea.  Genitourinary: Positive for hematuria. Negative for dysuria, urgency, frequency, flank pain, decreased urine volume and pelvic pain.  Skin: Positive for rash.  Neurological: Negative for dizziness, syncope, weakness, light-headedness and headaches.       Objective:   Physical Exam  Constitutional: She is oriented to person, place, and time. She appears well-developed and well-nourished. No distress.  Eyes: Conjunctivae and EOM are normal. Pupils are equal, round, and reactive to light.  Right eye with periorbital edema and erythema, no vesicular lesions noted  Neck: Normal range of motion. Neck supple.  Cardiovascular: Normal rate, regular rhythm, normal heart sounds and intact distal pulses.   Pulmonary/Chest: Effort normal and breath sounds normal.  Abdominal: Soft. Bowel sounds are normal. There is no tenderness. There is no CVA tenderness.  Neurological: She is alert and oriented to person, place, and time.  Skin: Skin is warm and dry.  Vesicular lesions with erythematous base in a linear orientation over both upper and lower extremities along with small vesicular lesion on torso and face   Psychiatric: She has a normal mood and affect. Her behavior is normal. Judgment and thought content normal.   Results for orders placed or performed in visit on 11/25/15  POCT urinalysis dipstick  Result Value Ref Range   Color, UA brown (A) yellow   Clarity, UA cloudy (A) clear   Glucose, UA negative negative   Bilirubin, UA small (A) negative    Ketones, POC UA negative negative   Spec Grav, UA 1.015    Blood, UA large (A) negative   pH, UA 7.0    Protein Ur, POC =100 (A) negative   Urobilinogen, UA 1.0    Nitrite, UA Negative Negative   Leukocytes, UA small (1+) (A) Negative  POCT Microscopic Urinalysis ([facility])  Result Value Ref Range   WBC,UR,HPF,POC Few (A) None WBC/hpf   RBC,UR,HPF,POC Too numerous to count  (A) None RBC/hpf   Bacteria None None, Too numerous to count   Mucus Absent Absent   Epithelial Cells, UR Per Microscopy Few (A) None, Too numerous to count cells/hpf   Dg Abd 1 View  11/25/2015  CLINICAL DATA:  Gross hematuria 6 days ago EXAM: ABDOMEN - 1 VIEW COMPARISON:  12/16/2012 FINDINGS: There are few small calcific densities over the right renal shadow. There is also an approximately 5 mm calcific density to the right of the L2-3 disc space which could be within the renal pelvis. It could also be within bowel. There is an intrauterine device. There is stool throughout the colon. IMPRESSION: Calcific densities right renal fossa possibly representing calculi Electronically Signed   By: Skipper Cliche M.D.   On: 11/25/2015 11:17          Assessment & Plan:  1. Blood in urine 5 cm calcific density seen on KUB. Likely nephrolithiasis. Follow up with urology for appointment tomorrow.  - POCT urinalysis dipstick - POCT Microscopic Urinalysis ([facility]) - DG Abd 1 View; Future  2. Poison ivy - predniSONE (DELTASONE) 20 MG tablet; Take 3 PO QAM x3days, 2 PO QAM x3days, 1 PO QAM x3days  Dispense: 18 tablet; Refill: 0  Melina Schools PA-S 11/25/2015

## 2015-11-26 ENCOUNTER — Other Ambulatory Visit: Payer: Self-pay | Admitting: Urology

## 2015-11-27 ENCOUNTER — Encounter (HOSPITAL_COMMUNITY): Payer: Self-pay | Admitting: *Deleted

## 2015-12-02 ENCOUNTER — Ambulatory Visit (HOSPITAL_COMMUNITY)
Admission: RE | Admit: 2015-12-02 | Discharge: 2015-12-02 | Disposition: A | Discharge status: Home / Self Care | Payer: BLUE CROSS/BLUE SHIELD | Source: Ambulatory Visit | Attending: Urology | Admitting: Urology

## 2015-12-02 ENCOUNTER — Encounter (HOSPITAL_COMMUNITY)
Admission: RE | Disposition: A | Discharge status: Home / Self Care | Payer: Self-pay | Source: Ambulatory Visit | Attending: Urology

## 2015-12-02 ENCOUNTER — Encounter (HOSPITAL_COMMUNITY): Payer: Self-pay | Admitting: General Practice

## 2015-12-02 ENCOUNTER — Ambulatory Visit (HOSPITAL_COMMUNITY): Payer: BLUE CROSS/BLUE SHIELD

## 2015-12-02 DIAGNOSIS — Z87442 Personal history of urinary calculi: Secondary | ICD-10-CM | POA: Insufficient documentation

## 2015-12-02 DIAGNOSIS — N200 Calculus of kidney: Secondary | ICD-10-CM | POA: Diagnosis present

## 2015-12-02 LAB — PREGNANCY, URINE: Preg Test, Ur: NEGATIVE

## 2015-12-02 SURGERY — LITHOTRIPSY, ESWL
Anesthesia: LOCAL | Laterality: Right

## 2015-12-02 MED ORDER — CIPROFLOXACIN HCL 500 MG PO TABS
500.0000 mg | ORAL_TABLET | ORAL | Status: AC
  Administered 2015-12-02: 500 mg via ORAL
  Filled 2015-12-02: qty 1

## 2015-12-02 MED ORDER — KETOROLAC TROMETHAMINE 30 MG/ML IJ SOLN: 30.0000 mg | Freq: Four times a day (QID) | INTRAMUSCULAR | Status: DC

## 2015-12-02 MED ORDER — ACETAMINOPHEN 650 MG RE SUPP
650.0000 mg | RECTAL | Status: DC | PRN
  Filled 2015-12-02: qty 1

## 2015-12-02 MED ORDER — SODIUM CHLORIDE 0.9% FLUSH: 3.0000 mL | Freq: Two times a day (BID) | INTRAVENOUS | Status: DC

## 2015-12-02 MED ORDER — SODIUM CHLORIDE 0.9% FLUSH: 3.0000 mL | INTRAVENOUS | Status: DC | PRN

## 2015-12-02 MED ORDER — SODIUM CHLORIDE 0.9 % IV SOLN
INTRAVENOUS | Status: DC
  Administered 2015-12-02: 08:00:00 via INTRAVENOUS

## 2015-12-02 MED ORDER — DIAZEPAM 5 MG PO TABS
10.0000 mg | ORAL_TABLET | ORAL | Status: AC
  Administered 2015-12-02: 10 mg via ORAL
  Filled 2015-12-02: qty 2

## 2015-12-02 MED ORDER — OXYCODONE HCL 5 MG PO TABS: 5.0000 mg | ORAL_TABLET | ORAL | Status: DC | PRN

## 2015-12-02 MED ORDER — DIPHENHYDRAMINE HCL 25 MG PO CAPS
25.0000 mg | ORAL_CAPSULE | ORAL | Status: AC
  Administered 2015-12-02: 25 mg via ORAL
  Filled 2015-12-02: qty 1

## 2015-12-02 MED ORDER — SODIUM CHLORIDE 0.9 % IV SOLN: 250.0000 mL | INTRAVENOUS | Status: DC | PRN

## 2015-12-02 MED ORDER — ACETAMINOPHEN 325 MG PO TABS: 650.0000 mg | ORAL_TABLET | ORAL | Status: DC | PRN

## 2015-12-02 NOTE — Op Note (Signed)
See [facility] OP note scanned into chart. 

## 2015-12-02 NOTE — Discharge Instructions (Signed)
See [facility] discharge instructions in chart.  

## 2015-12-02 NOTE — H&P (Signed)
  H&P  Chief Complaint: Kidney stone  History of Present Illness: Lindsay Valencia is a 40 y.o. year old female who presents for ESWL as primary mgmt of a 6 mm right UPJ/renal pelvic stone. She presented recently with gross painless hematuria. She saw Dr Karsten Ro and KUB revealed a 6 mm left renal pelvic stone as well as a smaller lower pole calculus. Treatment alternatives were discussed, and she decided on ESWL.  Past Medical History  Diagnosis Date  . Anxiety   . Horner's syndrome   . History of kidney stones   . Dental crowns present   . Carpal tunnel syndrome of right wrist 04/2015    Past Surgical History  Procedure Laterality Date  . Extracorporeal shock wave lithotripsy Right 12/05/2012  . Extracorporeal shock wave lithotripsy Left 09/29/2010  . Carpal tunnel release Right 04/25/2015    Procedure: RIGHT CARPAL TUNNEL RELEASE;  Surgeon: Daryll Brod, MD;  Location: [place];  Service: Orthopedics;  Laterality: Right;  . Carpal tunnel release Left 05/23/2015    Procedure: LEFT CARPAL TUNNEL RELEASE;  Surgeon: Daryll Brod, MD;  Location: [place];  Service: Orthopedics;  Laterality: Left;    Home Medications:  No prescriptions prior to admission    Allergies: No Known Allergies  Family History  Problem Relation Age of Onset  . Heart disease Maternal Grandmother     pacemaker  . Hypertension Maternal Grandmother   . Hyperlipidemia Mother     Social History:  reports that she has never smoked. She has never used smokeless tobacco. She reports that she does not drink alcohol or use illicit drugs.  ROS: Genitourinary and gastrointestinal system(s) were reviewed and pertinent findings if present are noted and are otherwise negative.  Genitourinary: hematuria.  Gastrointestinal: no flank pain.  Physical Exam:  Vital signs in last 24 hours:   General:  Alert and oriented, No acute distress HEENT: Normocephalic, atraumatic Neck: No JVD or  lymphadenopathy Cardiovascular: Regular rate and rhythm Lungs: Clear bilaterally Abdomen: Soft, nontender, nondistended, no abdominal masses Back: No CVA tenderness Extremities: No edema Neurologic: Grossly intact  Laboratory Data:  No results found for this or any previous visit (from the past 24 hour(s)). No results found for this or any previous visit (from the past 240 hour(s)). Creatinine: No results for input(s): CREATININE in the last 168 hours.  Radiologic Imaging: No results found.  Impression/Assessment: 6 mm left renal pelvic stone  Plan:  ESWL  Jorja Loa 12/02/2015, 6:26 AM  Lillette Boxer. Keya Wynes MD

## 2016-06-24 ENCOUNTER — Ambulatory Visit (INDEPENDENT_AMBULATORY_CARE_PROVIDER_SITE_OTHER): Payer: BLUE CROSS/BLUE SHIELD | Admitting: Physician Assistant

## 2016-06-24 VITALS — BP 122/78 | HR 100 | Temp 98.9°F | Resp 16 | Ht 65.0 in | Wt 159.0 lb

## 2016-06-24 DIAGNOSIS — J029 Acute pharyngitis, unspecified: Secondary | ICD-10-CM | POA: Diagnosis not present

## 2016-06-24 LAB — POCT RAPID STREP A (OFFICE): Rapid Strep A Screen: NEGATIVE

## 2016-06-24 NOTE — Progress Notes (Signed)
Patient ID: Lindsay Valencia, female    DOB: May 25, 1976, 40 y.o.   MRN: [id number]  PCP: Lovenia Kim, MD  Chief Complaint  Patient presents with  . Fever    In the evening   . Sore Throat    Subjective:   Presents for evaluation of fever and sore throat.  Daughter was ill for a couple of weeks, ended up with pneumonia.  In the evenings x 3 days, temperature 100.3. Sore throat, RIGHT sided. Feels achy. No coughing. No nasal congestion. RIGHT sided sinuses feel full. No ear pain, but feels full and popping. No nausea, vomiting or diarrhea. Not achy. Mucinex and acetaminophen.     Review of Systems As above.    Patient Active Problem List   Diagnosis Date Noted  . 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  . Carpal tunnel syndrome of right wrist 06/21/2015  . 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 Authorizing Provider  ALPRAZolam (XANAX) 0.5 MG tablet TK 1/2-1 T UP TO QID PRF AGITATION 09/18/15  Yes Historical Provider, MD  buPROPion (WELLBUTRIN SR) 150 MG 12 hr tablet TK 1 TABLET BY MOUTH Q AM 11/02/15  Yes Historical Provider, MD  clonazePAM (KLONOPIN) 0.5 MG tablet Take 0.5 mg by mouth 2 (two) times daily as needed for anxiety.   Yes Historical Provider, MD  levonorgestrel (MIRENA) 20 MCG/24HR IUD 1 each by Intrauterine route once.   Yes Historical Provider, MD  phentermine 15 MG capsule Take 18.5 mg by mouth.   Yes Historical Provider, MD  sertraline (ZOLOFT) 100 MG tablet Take 100 mg by mouth every evening.    Yes Historical Provider, MD     No Known Allergies     Objective:  Physical Exam  Constitutional: She is oriented to person, place, and time. She appears well-developed and well-nourished. She is active and cooperative. No distress.  BP 122/78   Pulse 100   Temp 98.9 F (37.2 C) (Oral)   Resp 16   Ht 5\' 5"  (1.651 m)   Wt  159 lb (72.1 kg)   SpO2 100%   BMI 26.46 kg/m   HENT:  Head: Normocephalic and atraumatic.  Right Ear: Hearing, tympanic membrane, external ear and ear canal normal.  Left Ear: Hearing, tympanic membrane, external ear and ear canal normal.  Nose: Nose normal.  Mouth/Throat: Uvula is midline, oropharynx is clear and moist and mucous membranes are normal. No uvula swelling.  Eyes: Conjunctivae are normal. No scleral icterus.  Neck: Normal range of motion, full passive range of motion without pain and phonation normal. Neck supple. No thyromegaly present.  Cardiovascular: Normal rate, regular rhythm and normal heart sounds.   Pulses:      Radial pulses are 2+ on the right side, and 2+ on the left side.  Pulmonary/Chest: Effort normal and breath sounds normal.  Lymphadenopathy:       Head (right side): No tonsillar, no preauricular, no posterior auricular and no occipital adenopathy present.       Head (left side): No tonsillar, no preauricular, no posterior auricular and no occipital adenopathy present.    She has no cervical adenopathy.       Right: No supraclavicular adenopathy present.       Left: No supraclavicular adenopathy present.  Neurological: She is alert and oriented to person, place, and time. No sensory deficit.  Skin: Skin  is warm, dry and intact. No rash noted. No cyanosis or erythema. Nails show no clubbing.  Psychiatric: She has a normal mood and affect. Her speech is normal and behavior is normal.       Results for orders placed or performed in visit on 06/24/16  POCT rapid strep A  Result Value Ref Range   Rapid Strep A Screen Negative Negative       Assessment & Plan:   1. Sore throat Likely viral. Supportive care. Anticipatory guidance. She declines MMW + Lidocaine. - POCT rapid strep A - Culture, Group A Strep   Fara Chute, PA-C Physician Assistant-Certified [facility]

## 2016-06-24 NOTE — Patient Instructions (Signed)
     IF you received an x-ray today, you will receive an invoice from [facility]. Please contact [facility] at [phone] with questions or concerns regarding your invoice.   IF you received labwork today, you will receive an invoice from [facility]. Please contact [facility] at [phone] with questions or concerns regarding your invoice.   Our billing staff will not be able to assist you with questions regarding bills from these companies.  You will be contacted with the lab results as soon as they are available. The fastest way to get your results is to activate your My Chart account. Instructions are located on the last page of this paperwork. If you have not heard from us regarding the results in 2 weeks, please contact this office.      

## 2016-06-26 LAB — CULTURE, GROUP A STREP

## 2016-07-01 ENCOUNTER — Telehealth: Payer: Self-pay

## 2016-07-01 NOTE — Telephone Encounter (Signed)
Patient called checking on an antibiotic prescription.  She saw Chelle on 11/22 and she had not been originally prescribed an antibiotic because it was believed to be a viral infection.  Patient states that the lab had called her stating that the strept culture came back positive.  Patient thought a prescription would have been called if the results came back positive.  Please advise!!  [phone]

## 2016-07-02 MED ORDER — AMOXICILLIN 875 MG PO TABS: 875.0000 mg | ORAL_TABLET | Freq: Two times a day (BID) | ORAL | 0 refills | Status: DC

## 2016-07-02 NOTE — Telephone Encounter (Signed)
Please call this patient. She DOES have a "few" strep bacteria in the throat. I've not ever seen the culture report say "few," actually. So, If she's still having sore throat: Amoxicillin 875 1 PO BID x 10 days, #20, NO REFILLS. If her sore throat is resolved, she doesn't need additional treatment.   8d ago       From lab result. rx sent to [facility]. Advised.

## 2016-09-03 ENCOUNTER — Ambulatory Visit (INDEPENDENT_AMBULATORY_CARE_PROVIDER_SITE_OTHER): Payer: BLUE CROSS/BLUE SHIELD | Admitting: Emergency Medicine

## 2016-09-03 VITALS — BP 134/88 | HR 91 | Temp 97.8°F | Resp 18 | Ht 65.0 in | Wt 162.0 lb

## 2016-09-03 DIAGNOSIS — R0981 Nasal congestion: Secondary | ICD-10-CM | POA: Diagnosis not present

## 2016-09-03 DIAGNOSIS — J0100 Acute maxillary sinusitis, unspecified: Secondary | ICD-10-CM

## 2016-09-03 MED ORDER — TRIAMCINOLONE ACETONIDE 55 MCG/ACT NA AERO: 2.0000 | INHALATION_SPRAY | Freq: Every day | NASAL | 12 refills | Status: DC

## 2016-09-03 MED ORDER — AZITHROMYCIN 250 MG PO TABS: ORAL_TABLET | ORAL | 0 refills | Status: DC

## 2016-09-03 NOTE — Patient Instructions (Signed)

## 2016-09-03 NOTE — Progress Notes (Signed)
Lindsay Valencia 41 y.o.   Chief Complaint  Patient presents with  . Sinusitis    had for couple of weeks  . Nasal Congestion    green sputum    HISTORY OF PRESENT ILLNESS: This is a 41 y.o. female complaining of sinus infection x 2 weeks, not getting better.  Sinusitis  This is a new problem. The current episode started 1 to 4 weeks ago. The problem has been gradually worsening since onset. There has been no fever (had chills). Her pain is at a severity of 5/10. The pain is moderate. Associated symptoms include chills, congestion, coughing and sinus pressure. Pertinent negatives include no ear pain, headaches, neck pain, shortness of breath, sore throat or swollen glands. Treatments tried: Mucinex-DM. The treatment provided no relief.     Prior to Admission medications   Medication Sig Start Date End Date Taking? Authorizing Provider  ALPRAZolam (XANAX) 0.5 MG tablet TK 1/2-1 T UP TO QID PRF AGITATION 09/18/15  Yes Historical Provider, MD  buPROPion (WELLBUTRIN SR) 150 MG 12 hr tablet TK 1 TABLET BY MOUTH Q AM 11/02/15  Yes Historical Provider, MD  clonazePAM (KLONOPIN) 0.5 MG tablet Take 0.5 mg by mouth 2 (two) times daily as needed for anxiety.   Yes Historical Provider, MD  levonorgestrel (MIRENA) 20 MCG/24HR IUD 1 each by Intrauterine route once.   Yes Historical Provider, MD  phentermine 15 MG capsule Take 18.5 mg by mouth.   Yes Historical Provider, MD  sertraline (ZOLOFT) 100 MG tablet Take 100 mg by mouth every evening.    Yes Historical Provider, MD    No Known Allergies  Patient Active Problem List   Diagnosis Date Noted  . 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  . Carpal tunnel syndrome of right wrist 06/21/2015  . 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 Anxiety   . Carpal tunnel syndrome of right wrist 04/2015  . Dental crowns present   . History of kidney stones   . Horner's  syndrome   . PP SVD 12/17/11 F 12/18/2011    Past Surgical History:  Procedure Laterality Date  . CARPAL TUNNEL RELEASE Right 04/25/2015   Procedure: RIGHT CARPAL TUNNEL RELEASE;  Surgeon: Daryll Brod, MD;  Location: [place];  Service: Orthopedics;  Laterality: Right;  . CARPAL TUNNEL RELEASE Left 05/23/2015   Procedure: LEFT CARPAL TUNNEL RELEASE;  Surgeon: Daryll Brod, MD;  Location: [facility];  Service: Orthopedics;  Laterality: Left;  . EXTRACORPOREAL SHOCK WAVE LITHOTRIPSY Right 12/05/2012  . EXTRACORPOREAL SHOCK WAVE LITHOTRIPSY Left 09/29/2010    Social History   Social History  . Marital status: Married    Spouse name: N/A  . Number of children: N/A  . Years of education: N/A   Occupational History  . Not on file.   Social History Main Topics  . Smoking status: Never Smoker  . Smokeless tobacco: Never Used  . Alcohol use No  . Drug use: No  . Sexual activity: Not on file   Other Topics Concern  . Not on file   Social History Narrative  . No narrative on file    Family History  Problem Relation Age of Onset  . Heart disease Maternal Grandmother     pacemaker  . Hypertension Maternal Grandmother   . Hyperlipidemia Mother      Review of Systems  Constitutional: Positive for chills.  HENT:  Positive for congestion and sinus pressure. Negative for ear pain, nosebleeds and sore throat.        +nasal purulent discharge  Eyes: Negative for discharge and redness.  Respiratory: Positive for cough and sputum production. Negative for hemoptysis, shortness of breath and wheezing.   Cardiovascular: Negative for chest pain, palpitations and leg swelling.  Gastrointestinal: Negative for abdominal pain, diarrhea, nausea and vomiting.  Genitourinary: Negative for dysuria and hematuria.  Musculoskeletal: Negative for back pain, myalgias and neck pain.  Neurological: Negative for dizziness, sensory change, focal weakness and headaches.    Endo/Heme/Allergies: Does not bruise/bleed easily.  All other systems reviewed and are negative.  Vitals:   09/03/16 1156  BP: 134/88  Pulse: 91  Resp: 18  Temp: 97.8 F (36.6 C)     Physical Exam  Constitutional: She is oriented to person, place, and time. She appears well-developed and well-nourished.  HENT:  Head: Normocephalic and atraumatic.  Nose: Right sinus exhibits maxillary sinus tenderness. Left sinus exhibits maxillary sinus tenderness.  Mouth/Throat: Oropharynx is clear and moist. No oropharyngeal exudate.  Eyes: Conjunctivae and EOM are normal. Pupils are equal, round, and reactive to light.  Neck: Normal range of motion. Neck supple. No JVD present. No thyromegaly present.  Cardiovascular: Normal rate, regular rhythm and normal heart sounds.   Pulmonary/Chest: Effort normal and breath sounds normal. She has no wheezes. She has no rales.  Abdominal: Soft. There is no tenderness.  Musculoskeletal: Normal range of motion.  Lymphadenopathy:    She has no cervical adenopathy.  Neurological: She is alert and oriented to person, place, and time. No sensory deficit. She exhibits normal muscle tone.  Skin: Skin is warm and dry. Capillary refill takes less than 2 seconds.  Psychiatric: She has a normal mood and affect. Her behavior is normal.  Vitals reviewed.    ASSESSMENT & PLAN: [name] was seen today for sinusitis and nasal congestion.  Diagnoses and all orders for this visit:  Acute non-recurrent maxillary sinusitis  Sinus congestion  Other orders -     azithromycin (ZITHROMAX) 250 MG tablet; Sig as indicated -     triamcinolone (NASACORT AQ) 55 MCG/ACT AERO nasal inhaler; Place 2 sprays into the nose daily.    Patient Instructions  Sinusitis, Adult Sinusitis is soreness and inflammation of your sinuses. Sinuses are hollow spaces in the bones around your face. They are located:  Around your eyes.  In the middle of your forehead.  Behind your  nose.  In your cheekbones. Your sinuses and nasal passages are lined with a stringy fluid (mucus). Mucus normally drains out of your sinuses. When your nasal tissues get inflamed or swollen, the mucus can get trapped or blocked so air cannot flow through your sinuses. This lets bacteria, viruses, and funguses grow, and that leads to infection. Follow these instructions at home: Medicines  Take, use, or apply over-the-counter and prescription medicines only as told by your doctor. These may include nasal sprays.  If you were prescribed an antibiotic medicine, take it as told by your doctor. Do not stop taking the antibiotic even if you start to feel better. Hydrate and Humidify  Drink enough water to keep your pee (urine) clear or pale yellow.  Use a cool mist humidifier to keep the humidity level in your home above 50%.  Breathe in steam for 10-15 minutes, 3-4 times a day or as told by your doctor. You can do this in the bathroom while a hot shower is running.  Try not to spend time in cool or dry air. Rest  Rest as much as possible.  Sleep with your head raised (elevated).  Make sure to get enough sleep each night. General instructions  Put a warm, moist washcloth on your face 3-4 times a day or as told by your doctor. This will help with discomfort.  Wash your hands often with soap and water. If there is no soap and water, use hand sanitizer.  Do not smoke. Avoid being around people who are smoking (secondhand smoke).  Keep all follow-up visits as told by your doctor. This is important. Contact a doctor if:  You have a fever.  Your symptoms get worse.  Your symptoms do not get better within 10 days. Get help right away if:  You have a very bad headache.  You cannot stop throwing up (vomiting).  You have pain or swelling around your face or eyes.  You have trouble seeing.  You feel confused.  Your neck is stiff.  You have trouble breathing. This information  is not intended to replace advice given to you by your health care provider. Make sure you discuss any questions you have with your health care provider. Document Released: 01/06/2008 Document Revised: 03/15/2016 Document Reviewed: 05/15/2015 Elsevier Interactive Patient Education  2017 [employer]      Agustina Caroli, MD [facility]

## 2016-09-18 ENCOUNTER — Ambulatory Visit (INDEPENDENT_AMBULATORY_CARE_PROVIDER_SITE_OTHER): Payer: BLUE CROSS/BLUE SHIELD | Admitting: Family Medicine

## 2016-09-18 VITALS — BP 134/88 | HR 99 | Temp 99.0°F | Resp 16 | Ht 65.0 in | Wt 163.0 lb

## 2016-09-18 DIAGNOSIS — W5501XA Bitten by cat, initial encounter: Secondary | ICD-10-CM

## 2016-09-18 DIAGNOSIS — S61052A Open bite of left thumb without damage to nail, initial encounter: Secondary | ICD-10-CM | POA: Diagnosis not present

## 2016-09-18 DIAGNOSIS — L0390 Cellulitis, unspecified: Secondary | ICD-10-CM

## 2016-09-18 MED ORDER — AMOXICILLIN-POT CLAVULANATE 875-125 MG PO TABS: 1.0000 | ORAL_TABLET | Freq: Two times a day (BID) | ORAL | 0 refills | Status: DC

## 2016-09-18 MED ORDER — FLUCONAZOLE 150 MG PO TABS: 150.0000 mg | ORAL_TABLET | Freq: Once | ORAL | 0 refills | Status: AC

## 2016-09-18 MED ORDER — CEFTRIAXONE SODIUM 1 G IJ SOLR
1.0000 g | Freq: Once | INTRAMUSCULAR | Status: AC
  Administered 2016-09-18: 1 g via INTRAMUSCULAR

## 2016-09-18 NOTE — Patient Instructions (Addendum)
Start Augmentin 1 tablet twice daily with food to avoid stomach upset x10 day. Complete all medication.  Return for follow-up 5 days. As we discussed below are warning signs of worsening infection and when you should follow-up sooner.    IF you received an x-ray today, you will receive an invoice from [facility]. Please contact [facility] at [phone] with questions or concerns regarding your invoice.   IF you received labwork today, you will receive an invoice from [place]. Please contact [facility] at [phone] with questions or concerns regarding your invoice.   Our billing staff will not be able to assist you with questions regarding bills from these companies.  You will be contacted with the lab results as soon as they are available. The fastest way to get your results is to activate your My Chart account. Instructions are located on the last page of this paperwork. If you have not heard from [place] regarding the results in 2 weeks, please contact this office.      Animal Bite Introduction Animal bite wounds can get infected. It is important to get proper medical treatment. Ask your doctor if you need rabies treatment. Follow these instructions at home: Wound care  Follow instructions from your doctor about how to take care of your wound. Make sure you:  Wash your hands with soap and water before you change your bandage (dressing). If you cannot use soap and water, use hand sanitizer.  Change your bandage as told by your doctor.  Leave stitches (sutures), skin glue, or skin tape (adhesive) strips in place. They may need to stay in place for 2 weeks or longer. If tape strips get loose and curl up, you may trim the loose edges. Do not remove tape strips completely unless your doctor says it is okay.  Check your wound every day for signs of infection. Watch for:  Redness, swelling, or pain that gets worse.  Fluid, blood, or pus. General instructions  Take  or apply over-the-counter and prescription medicines only as told by your doctor.  If you were prescribed an antibiotic, take or apply it as told by your doctor. Do not stop using the antibiotic even if your condition improves.  Keep the injured area raised (elevated) above the level of your heart while you are sitting or lying down.  If directed, apply ice to the injured area.  Put ice in a plastic bag.  Place a towel between your skin and the bag.  Leave the ice on for 20 minutes, 2-3 times per day.  Keep all follow-up visits as told by your doctor. This is important. Contact a doctor if:  You have redness, swelling, or pain that gets worse.  You have a general feeling of sickness (malaise).  You feel sick to your stomach (nauseous).  You throw up (vomit).  You have pain that does not get better. Get help right away if:  You have a red streak going away from your wound.  You have fluid, blood, or pus coming from your wound.  You have a fever or chills.  You have trouble moving your injured area.  You have numbness or tingling anywhere on your body. This information is not intended to replace advice given to you by your health care provider. Make sure you discuss any questions you have with your health care provider. Document Released: 07/20/2005 Document Revised: 12/26/2015 Document Reviewed: 12/05/2014  2017 Elsevier

## 2016-09-18 NOTE — Progress Notes (Signed)
Patient ID: Lindsay Valencia, female    DOB: Jan 05, 1976, 41 y.o.   MRN: [id number]  PCP: Lovenia Kim, MD  No chief complaint on file.   Subjective:  HPI 41 year old female presents for evaluation of a cat bite x 1 day. She was bathing her personal cat and during the process of drying the animal, the cat bite her on the left lower thumb. She has experienced some nausea. Denies fever or chills. The cat is an indoor cat that she has owned for the entire life of the pet.   Social History   Social History  . Marital status: Married    Spouse name: N/A  . Number of children: N/A  . Years of education: N/A   Occupational History  . Not on file.   Social History Main Topics  . Smoking status: Never Smoker  . Smokeless tobacco: Never Used  . Alcohol use No  . Drug use: No  . Sexual activity: Not on file   Other Topics Concern  . Not on file   Social History Narrative  . No narrative on file    Family History  Problem Relation Age of Onset  . Heart disease Maternal Grandmother     pacemaker  . Hypertension Maternal Grandmother   . Hyperlipidemia Mother    Review of Systems See HPI    Patient Active Problem List   Diagnosis Date Noted  . Acute non-recurrent maxillary sinusitis 09/03/2016  . 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  . Carpal tunnel syndrome of right wrist 06/21/2015  . 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 Authorizing Provider  ALPRAZolam (XANAX) 0.5 MG tablet TK 1/2-1 T UP TO QID PRF AGITATION 09/18/15  Yes Historical Provider, MD  buPROPion (WELLBUTRIN SR) 150 MG 12 hr tablet TK 1 TABLET BY MOUTH Q AM 11/02/15  Yes Historical Provider, MD  clonazePAM (KLONOPIN) 0.5 MG tablet Take 0.5 mg by mouth 2 (two) times daily as needed for anxiety.   Yes Historical Provider, MD  levonorgestrel (MIRENA) 20 MCG/24HR  IUD 1 each by Intrauterine route once.   Yes Historical Provider, MD  phentermine 15 MG capsule Take 18.5 mg by mouth.   Yes Historical Provider, MD  sertraline (ZOLOFT) 100 MG tablet Take 100 mg by mouth every evening.    Yes Historical Provider, MD  azithromycin (ZITHROMAX) 250 MG tablet Sig as indicated Patient not taking: Reported on 09/18/2016 09/03/16   Horald Pollen, MD  triamcinolone (NASACORT AQ) 55 MCG/ACT AERO nasal inhaler Place 2 sprays into the nose daily. Patient not taking: Reported on 09/18/2016 09/03/16   Horald Pollen, MD    Past Medical, Surgical Family and Social History reviewed and updated.    Objective:   Today's Vitals   09/18/16 1214  BP: 134/88  Pulse: 99  Resp: 16  Temp: 99 F (37.2 C)  TempSrc: Oral  SpO2: 99%  Weight: 163 lb (73.9 kg)  Height: 5\' 5"  (1.651 m)    Wt Readings from Last 3 Encounters:  09/18/16 163 lb (73.9 kg)  09/03/16 162 lb (73.5 kg)  06/24/16 159 lb (72.1 kg)    Physical Exam  Constitutional: She is oriented to person, place, and time. She appears well-developed and well-nourished.  HENT:  Head: Normocephalic and atraumatic.  Eyes: Pupils are equal, round, and reactive to light.  Cardiovascular: Normal rate, regular rhythm and normal heart sounds.   Pulmonary/Chest: Effort normal and breath sounds normal.  Musculoskeletal:       Arms: Neurological: She is alert and oriented to person, place, and time.  Skin: Skin is warm and dry.  Left lower thumb two bilateral puncture wounds present. Mild erythema is present at and below the site of animal bite.   Psychiatric: She has a normal mood and affect. Her behavior is normal. Judgment and thought content normal.           Assessment & Plan:  1. Cellulitis, unspecified cellulitis site 2. Cat bite of left thumb, initial encounter TDAP Within 5 years Plan: Start Augmentin 1 tablet twice daily with food to avoid stomach upset x10 day. Complete all  medication. Will consider an x-ray at follow-up if pain remains pronounced.  Carroll Sage. Kenton Kingfisher, MSN, FNP-C [facility]

## 2018-01-18 DIAGNOSIS — A6920 Lyme disease, unspecified: Secondary | ICD-10-CM | POA: Insufficient documentation

## 2018-04-04 DIAGNOSIS — F321 Major depressive disorder, single episode, moderate: Secondary | ICD-10-CM | POA: Diagnosis not present

## 2018-04-12 DIAGNOSIS — F321 Major depressive disorder, single episode, moderate: Secondary | ICD-10-CM | POA: Diagnosis not present

## 2018-04-18 DIAGNOSIS — F321 Major depressive disorder, single episode, moderate: Secondary | ICD-10-CM | POA: Diagnosis not present

## 2018-04-25 DIAGNOSIS — F321 Major depressive disorder, single episode, moderate: Secondary | ICD-10-CM | POA: Diagnosis not present

## 2018-04-27 DIAGNOSIS — F332 Major depressive disorder, recurrent severe without psychotic features: Secondary | ICD-10-CM | POA: Diagnosis not present

## 2018-04-27 DIAGNOSIS — F422 Mixed obsessional thoughts and acts: Secondary | ICD-10-CM | POA: Diagnosis not present

## 2018-04-27 DIAGNOSIS — F509 Eating disorder, unspecified: Secondary | ICD-10-CM | POA: Diagnosis not present

## 2018-04-27 DIAGNOSIS — F609 Personality disorder, unspecified: Secondary | ICD-10-CM | POA: Diagnosis not present

## 2018-05-02 DIAGNOSIS — F321 Major depressive disorder, single episode, moderate: Secondary | ICD-10-CM | POA: Diagnosis not present

## 2018-05-04 DIAGNOSIS — M7022 Olecranon bursitis, left elbow: Secondary | ICD-10-CM | POA: Diagnosis not present

## 2018-05-04 DIAGNOSIS — M2550 Pain in unspecified joint: Secondary | ICD-10-CM | POA: Diagnosis not present

## 2018-05-04 DIAGNOSIS — M459 Ankylosing spondylitis of unspecified sites in spine: Secondary | ICD-10-CM | POA: Diagnosis not present

## 2018-05-04 DIAGNOSIS — R5383 Other fatigue: Secondary | ICD-10-CM | POA: Diagnosis not present

## 2018-05-04 DIAGNOSIS — E559 Vitamin D deficiency, unspecified: Secondary | ICD-10-CM | POA: Diagnosis not present

## 2018-05-09 DIAGNOSIS — F321 Major depressive disorder, single episode, moderate: Secondary | ICD-10-CM | POA: Diagnosis not present

## 2018-05-17 DIAGNOSIS — F321 Major depressive disorder, single episode, moderate: Secondary | ICD-10-CM | POA: Diagnosis not present

## 2018-05-23 DIAGNOSIS — F321 Major depressive disorder, single episode, moderate: Secondary | ICD-10-CM | POA: Diagnosis not present

## 2018-05-30 DIAGNOSIS — F321 Major depressive disorder, single episode, moderate: Secondary | ICD-10-CM | POA: Diagnosis not present

## 2018-06-06 DIAGNOSIS — F321 Major depressive disorder, single episode, moderate: Secondary | ICD-10-CM | POA: Diagnosis not present

## 2018-06-13 DIAGNOSIS — F321 Major depressive disorder, single episode, moderate: Secondary | ICD-10-CM | POA: Diagnosis not present

## 2018-06-15 DIAGNOSIS — F321 Major depressive disorder, single episode, moderate: Secondary | ICD-10-CM | POA: Diagnosis not present

## 2018-06-20 DIAGNOSIS — F321 Major depressive disorder, single episode, moderate: Secondary | ICD-10-CM | POA: Diagnosis not present

## 2018-07-04 DIAGNOSIS — F321 Major depressive disorder, single episode, moderate: Secondary | ICD-10-CM | POA: Diagnosis not present

## 2018-07-05 DIAGNOSIS — F332 Major depressive disorder, recurrent severe without psychotic features: Secondary | ICD-10-CM | POA: Diagnosis not present

## 2018-07-11 DIAGNOSIS — B951 Streptococcus, group B, as the cause of diseases classified elsewhere: Secondary | ICD-10-CM | POA: Diagnosis not present

## 2018-07-11 DIAGNOSIS — N390 Urinary tract infection, site not specified: Secondary | ICD-10-CM | POA: Diagnosis not present

## 2018-07-11 DIAGNOSIS — N201 Calculus of ureter: Secondary | ICD-10-CM | POA: Diagnosis not present

## 2018-07-11 DIAGNOSIS — M549 Dorsalgia, unspecified: Secondary | ICD-10-CM | POA: Diagnosis not present

## 2018-07-11 DIAGNOSIS — F321 Major depressive disorder, single episode, moderate: Secondary | ICD-10-CM | POA: Diagnosis not present

## 2018-07-14 DIAGNOSIS — N201 Calculus of ureter: Secondary | ICD-10-CM | POA: Diagnosis not present

## 2018-07-14 DIAGNOSIS — N23 Unspecified renal colic: Secondary | ICD-10-CM | POA: Diagnosis not present

## 2018-07-15 DIAGNOSIS — S62605A Fracture of unspecified phalanx of left ring finger, initial encounter for closed fracture: Secondary | ICD-10-CM | POA: Diagnosis not present

## 2018-07-18 DIAGNOSIS — F321 Major depressive disorder, single episode, moderate: Secondary | ICD-10-CM | POA: Diagnosis not present

## 2018-07-25 DIAGNOSIS — F321 Major depressive disorder, single episode, moderate: Secondary | ICD-10-CM | POA: Diagnosis not present

## 2018-08-01 DIAGNOSIS — F321 Major depressive disorder, single episode, moderate: Secondary | ICD-10-CM | POA: Diagnosis not present

## 2018-08-04 DIAGNOSIS — N201 Calculus of ureter: Secondary | ICD-10-CM | POA: Diagnosis not present

## 2018-08-08 DIAGNOSIS — F321 Major depressive disorder, single episode, moderate: Secondary | ICD-10-CM | POA: Diagnosis not present

## 2018-08-15 DIAGNOSIS — F321 Major depressive disorder, single episode, moderate: Secondary | ICD-10-CM | POA: Diagnosis not present

## 2018-08-22 DIAGNOSIS — F321 Major depressive disorder, single episode, moderate: Secondary | ICD-10-CM | POA: Diagnosis not present

## 2018-08-29 DIAGNOSIS — F321 Major depressive disorder, single episode, moderate: Secondary | ICD-10-CM | POA: Diagnosis not present

## 2018-08-31 DIAGNOSIS — F422 Mixed obsessional thoughts and acts: Secondary | ICD-10-CM | POA: Diagnosis not present

## 2018-08-31 DIAGNOSIS — F609 Personality disorder, unspecified: Secondary | ICD-10-CM | POA: Diagnosis not present

## 2018-08-31 DIAGNOSIS — F509 Eating disorder, unspecified: Secondary | ICD-10-CM | POA: Diagnosis not present

## 2018-08-31 DIAGNOSIS — F411 Generalized anxiety disorder: Secondary | ICD-10-CM | POA: Diagnosis not present

## 2018-08-31 DIAGNOSIS — F332 Major depressive disorder, recurrent severe without psychotic features: Secondary | ICD-10-CM | POA: Diagnosis not present

## 2018-09-05 DIAGNOSIS — F321 Major depressive disorder, single episode, moderate: Secondary | ICD-10-CM | POA: Diagnosis not present

## 2018-09-12 DIAGNOSIS — H5213 Myopia, bilateral: Secondary | ICD-10-CM | POA: Diagnosis not present

## 2018-09-13 DIAGNOSIS — F321 Major depressive disorder, single episode, moderate: Secondary | ICD-10-CM | POA: Diagnosis not present

## 2018-09-19 DIAGNOSIS — F321 Major depressive disorder, single episode, moderate: Secondary | ICD-10-CM | POA: Diagnosis not present

## 2018-09-27 DIAGNOSIS — F321 Major depressive disorder, single episode, moderate: Secondary | ICD-10-CM | POA: Diagnosis not present

## 2018-09-29 DIAGNOSIS — S41102A Unspecified open wound of left upper arm, initial encounter: Secondary | ICD-10-CM | POA: Diagnosis not present

## 2018-10-03 DIAGNOSIS — F321 Major depressive disorder, single episode, moderate: Secondary | ICD-10-CM | POA: Diagnosis not present

## 2018-10-06 DIAGNOSIS — F609 Personality disorder, unspecified: Secondary | ICD-10-CM | POA: Diagnosis not present

## 2018-10-06 DIAGNOSIS — F332 Major depressive disorder, recurrent severe without psychotic features: Secondary | ICD-10-CM | POA: Diagnosis not present

## 2018-10-06 DIAGNOSIS — F422 Mixed obsessional thoughts and acts: Secondary | ICD-10-CM | POA: Diagnosis not present

## 2018-10-06 DIAGNOSIS — F509 Eating disorder, unspecified: Secondary | ICD-10-CM | POA: Diagnosis not present

## 2018-10-11 DIAGNOSIS — F321 Major depressive disorder, single episode, moderate: Secondary | ICD-10-CM | POA: Diagnosis not present

## 2018-10-17 DIAGNOSIS — F321 Major depressive disorder, single episode, moderate: Secondary | ICD-10-CM | POA: Diagnosis not present

## 2018-10-24 DIAGNOSIS — F321 Major depressive disorder, single episode, moderate: Secondary | ICD-10-CM | POA: Diagnosis not present

## 2018-10-31 DIAGNOSIS — F321 Major depressive disorder, single episode, moderate: Secondary | ICD-10-CM | POA: Diagnosis not present

## 2018-11-07 DIAGNOSIS — F321 Major depressive disorder, single episode, moderate: Secondary | ICD-10-CM | POA: Diagnosis not present

## 2018-11-15 DIAGNOSIS — F321 Major depressive disorder, single episode, moderate: Secondary | ICD-10-CM | POA: Diagnosis not present

## 2018-11-21 DIAGNOSIS — F321 Major depressive disorder, single episode, moderate: Secondary | ICD-10-CM | POA: Diagnosis not present

## 2018-11-23 DIAGNOSIS — F422 Mixed obsessional thoughts and acts: Secondary | ICD-10-CM | POA: Diagnosis not present

## 2018-11-23 DIAGNOSIS — F332 Major depressive disorder, recurrent severe without psychotic features: Secondary | ICD-10-CM | POA: Diagnosis not present

## 2018-11-23 DIAGNOSIS — F609 Personality disorder, unspecified: Secondary | ICD-10-CM | POA: Diagnosis not present

## 2018-11-23 DIAGNOSIS — F509 Eating disorder, unspecified: Secondary | ICD-10-CM | POA: Diagnosis not present

## 2018-11-28 DIAGNOSIS — F321 Major depressive disorder, single episode, moderate: Secondary | ICD-10-CM | POA: Diagnosis not present

## 2018-12-05 DIAGNOSIS — F321 Major depressive disorder, single episode, moderate: Secondary | ICD-10-CM | POA: Diagnosis not present

## 2018-12-12 DIAGNOSIS — F321 Major depressive disorder, single episode, moderate: Secondary | ICD-10-CM | POA: Diagnosis not present

## 2018-12-19 DIAGNOSIS — F321 Major depressive disorder, single episode, moderate: Secondary | ICD-10-CM | POA: Diagnosis not present

## 2018-12-22 DIAGNOSIS — F509 Eating disorder, unspecified: Secondary | ICD-10-CM | POA: Diagnosis not present

## 2018-12-22 DIAGNOSIS — F332 Major depressive disorder, recurrent severe without psychotic features: Secondary | ICD-10-CM | POA: Diagnosis not present

## 2018-12-22 DIAGNOSIS — F609 Personality disorder, unspecified: Secondary | ICD-10-CM | POA: Diagnosis not present

## 2018-12-22 DIAGNOSIS — F422 Mixed obsessional thoughts and acts: Secondary | ICD-10-CM | POA: Diagnosis not present

## 2019-01-02 DIAGNOSIS — F321 Major depressive disorder, single episode, moderate: Secondary | ICD-10-CM | POA: Diagnosis not present

## 2019-01-11 DIAGNOSIS — F321 Major depressive disorder, single episode, moderate: Secondary | ICD-10-CM | POA: Diagnosis not present

## 2019-01-16 DIAGNOSIS — F321 Major depressive disorder, single episode, moderate: Secondary | ICD-10-CM | POA: Diagnosis not present

## 2019-01-23 DIAGNOSIS — F321 Major depressive disorder, single episode, moderate: Secondary | ICD-10-CM | POA: Diagnosis not present

## 2019-01-30 DIAGNOSIS — F321 Major depressive disorder, single episode, moderate: Secondary | ICD-10-CM | POA: Diagnosis not present

## 2019-02-13 DIAGNOSIS — F321 Major depressive disorder, single episode, moderate: Secondary | ICD-10-CM | POA: Diagnosis not present

## 2019-02-20 DIAGNOSIS — F321 Major depressive disorder, single episode, moderate: Secondary | ICD-10-CM | POA: Diagnosis not present

## 2019-02-23 DIAGNOSIS — F609 Personality disorder, unspecified: Secondary | ICD-10-CM | POA: Diagnosis not present

## 2019-02-23 DIAGNOSIS — F509 Eating disorder, unspecified: Secondary | ICD-10-CM | POA: Diagnosis not present

## 2019-02-23 DIAGNOSIS — F332 Major depressive disorder, recurrent severe without psychotic features: Secondary | ICD-10-CM | POA: Diagnosis not present

## 2019-02-23 DIAGNOSIS — F422 Mixed obsessional thoughts and acts: Secondary | ICD-10-CM | POA: Diagnosis not present

## 2019-02-27 DIAGNOSIS — F321 Major depressive disorder, single episode, moderate: Secondary | ICD-10-CM | POA: Diagnosis not present

## 2019-03-20 DIAGNOSIS — F321 Major depressive disorder, single episode, moderate: Secondary | ICD-10-CM | POA: Diagnosis not present

## 2019-03-24 DIAGNOSIS — Z01419 Encounter for gynecological examination (general) (routine) without abnormal findings: Secondary | ICD-10-CM | POA: Diagnosis not present

## 2019-03-24 DIAGNOSIS — Z6829 Body mass index (BMI) 29.0-29.9, adult: Secondary | ICD-10-CM | POA: Diagnosis not present

## 2019-03-24 DIAGNOSIS — Z1231 Encounter for screening mammogram for malignant neoplasm of breast: Secondary | ICD-10-CM | POA: Diagnosis not present

## 2019-03-24 DIAGNOSIS — Z1151 Encounter for screening for human papillomavirus (HPV): Secondary | ICD-10-CM | POA: Diagnosis not present

## 2019-03-24 DIAGNOSIS — Z124 Encounter for screening for malignant neoplasm of cervix: Secondary | ICD-10-CM | POA: Diagnosis not present

## 2019-03-27 DIAGNOSIS — F321 Major depressive disorder, single episode, moderate: Secondary | ICD-10-CM | POA: Diagnosis not present

## 2019-04-03 DIAGNOSIS — F321 Major depressive disorder, single episode, moderate: Secondary | ICD-10-CM | POA: Diagnosis not present

## 2019-04-04 DIAGNOSIS — F422 Mixed obsessional thoughts and acts: Secondary | ICD-10-CM | POA: Diagnosis not present

## 2019-04-04 DIAGNOSIS — F509 Eating disorder, unspecified: Secondary | ICD-10-CM | POA: Diagnosis not present

## 2019-04-04 DIAGNOSIS — F609 Personality disorder, unspecified: Secondary | ICD-10-CM | POA: Diagnosis not present

## 2019-04-04 DIAGNOSIS — F332 Major depressive disorder, recurrent severe without psychotic features: Secondary | ICD-10-CM | POA: Diagnosis not present

## 2019-04-11 DIAGNOSIS — F321 Major depressive disorder, single episode, moderate: Secondary | ICD-10-CM | POA: Diagnosis not present

## 2019-04-18 DIAGNOSIS — F321 Major depressive disorder, single episode, moderate: Secondary | ICD-10-CM | POA: Diagnosis not present

## 2019-04-24 DIAGNOSIS — F321 Major depressive disorder, single episode, moderate: Secondary | ICD-10-CM | POA: Diagnosis not present

## 2019-05-01 DIAGNOSIS — F321 Major depressive disorder, single episode, moderate: Secondary | ICD-10-CM | POA: Diagnosis not present

## 2019-05-08 DIAGNOSIS — F321 Major depressive disorder, single episode, moderate: Secondary | ICD-10-CM | POA: Diagnosis not present

## 2019-05-15 DIAGNOSIS — F321 Major depressive disorder, single episode, moderate: Secondary | ICD-10-CM | POA: Diagnosis not present

## 2019-05-22 DIAGNOSIS — F321 Major depressive disorder, single episode, moderate: Secondary | ICD-10-CM | POA: Diagnosis not present

## 2019-05-29 DIAGNOSIS — F321 Major depressive disorder, single episode, moderate: Secondary | ICD-10-CM | POA: Diagnosis not present

## 2019-06-05 DIAGNOSIS — F321 Major depressive disorder, single episode, moderate: Secondary | ICD-10-CM | POA: Diagnosis not present

## 2019-06-13 DIAGNOSIS — F321 Major depressive disorder, single episode, moderate: Secondary | ICD-10-CM | POA: Diagnosis not present

## 2019-06-19 DIAGNOSIS — F321 Major depressive disorder, single episode, moderate: Secondary | ICD-10-CM | POA: Diagnosis not present

## 2019-06-26 DIAGNOSIS — F321 Major depressive disorder, single episode, moderate: Secondary | ICD-10-CM | POA: Diagnosis not present

## 2019-07-04 DIAGNOSIS — F321 Major depressive disorder, single episode, moderate: Secondary | ICD-10-CM | POA: Diagnosis not present

## 2019-07-10 DIAGNOSIS — F321 Major depressive disorder, single episode, moderate: Secondary | ICD-10-CM | POA: Diagnosis not present

## 2019-07-18 DIAGNOSIS — F321 Major depressive disorder, single episode, moderate: Secondary | ICD-10-CM | POA: Diagnosis not present

## 2019-08-09 DIAGNOSIS — F321 Major depressive disorder, single episode, moderate: Secondary | ICD-10-CM | POA: Diagnosis not present

## 2019-08-14 DIAGNOSIS — F321 Major depressive disorder, single episode, moderate: Secondary | ICD-10-CM | POA: Diagnosis not present

## 2019-08-21 DIAGNOSIS — F321 Major depressive disorder, single episode, moderate: Secondary | ICD-10-CM | POA: Diagnosis not present

## 2019-08-28 DIAGNOSIS — F321 Major depressive disorder, single episode, moderate: Secondary | ICD-10-CM | POA: Diagnosis not present

## 2019-09-04 DIAGNOSIS — F321 Major depressive disorder, single episode, moderate: Secondary | ICD-10-CM | POA: Diagnosis not present

## 2019-09-05 DIAGNOSIS — F609 Personality disorder, unspecified: Secondary | ICD-10-CM | POA: Diagnosis not present

## 2019-09-05 DIAGNOSIS — F332 Major depressive disorder, recurrent severe without psychotic features: Secondary | ICD-10-CM | POA: Diagnosis not present

## 2019-09-05 DIAGNOSIS — F509 Eating disorder, unspecified: Secondary | ICD-10-CM | POA: Diagnosis not present

## 2019-09-05 DIAGNOSIS — F422 Mixed obsessional thoughts and acts: Secondary | ICD-10-CM | POA: Diagnosis not present

## 2019-09-11 DIAGNOSIS — F321 Major depressive disorder, single episode, moderate: Secondary | ICD-10-CM | POA: Diagnosis not present

## 2019-09-14 DIAGNOSIS — H5213 Myopia, bilateral: Secondary | ICD-10-CM | POA: Diagnosis not present

## 2019-09-25 DIAGNOSIS — F321 Major depressive disorder, single episode, moderate: Secondary | ICD-10-CM | POA: Diagnosis not present

## 2019-10-02 DIAGNOSIS — F321 Major depressive disorder, single episode, moderate: Secondary | ICD-10-CM | POA: Diagnosis not present

## 2019-10-03 DIAGNOSIS — F332 Major depressive disorder, recurrent severe without psychotic features: Secondary | ICD-10-CM | POA: Diagnosis not present

## 2019-10-03 DIAGNOSIS — F422 Mixed obsessional thoughts and acts: Secondary | ICD-10-CM | POA: Diagnosis not present

## 2019-10-03 DIAGNOSIS — F509 Eating disorder, unspecified: Secondary | ICD-10-CM | POA: Diagnosis not present

## 2019-10-03 DIAGNOSIS — F609 Personality disorder, unspecified: Secondary | ICD-10-CM | POA: Diagnosis not present

## 2019-10-04 DIAGNOSIS — Z79899 Other long term (current) drug therapy: Secondary | ICD-10-CM | POA: Diagnosis not present

## 2019-10-10 DIAGNOSIS — F321 Major depressive disorder, single episode, moderate: Secondary | ICD-10-CM | POA: Diagnosis not present

## 2019-10-17 DIAGNOSIS — F321 Major depressive disorder, single episode, moderate: Secondary | ICD-10-CM | POA: Diagnosis not present

## 2019-10-23 DIAGNOSIS — F321 Major depressive disorder, single episode, moderate: Secondary | ICD-10-CM | POA: Diagnosis not present

## 2019-10-30 DIAGNOSIS — F321 Major depressive disorder, single episode, moderate: Secondary | ICD-10-CM | POA: Diagnosis not present

## 2019-11-06 DIAGNOSIS — F321 Major depressive disorder, single episode, moderate: Secondary | ICD-10-CM | POA: Diagnosis not present

## 2019-11-14 DIAGNOSIS — Z79899 Other long term (current) drug therapy: Secondary | ICD-10-CM | POA: Diagnosis not present

## 2019-11-14 DIAGNOSIS — F321 Major depressive disorder, single episode, moderate: Secondary | ICD-10-CM | POA: Diagnosis not present

## 2019-11-28 DIAGNOSIS — F321 Major depressive disorder, single episode, moderate: Secondary | ICD-10-CM | POA: Diagnosis not present

## 2019-12-04 DIAGNOSIS — F321 Major depressive disorder, single episode, moderate: Secondary | ICD-10-CM | POA: Diagnosis not present

## 2019-12-12 DIAGNOSIS — F321 Major depressive disorder, single episode, moderate: Secondary | ICD-10-CM | POA: Diagnosis not present

## 2019-12-18 DIAGNOSIS — F321 Major depressive disorder, single episode, moderate: Secondary | ICD-10-CM | POA: Diagnosis not present

## 2019-12-25 DIAGNOSIS — F321 Major depressive disorder, single episode, moderate: Secondary | ICD-10-CM | POA: Diagnosis not present

## 2020-01-02 DIAGNOSIS — F321 Major depressive disorder, single episode, moderate: Secondary | ICD-10-CM | POA: Diagnosis not present

## 2020-01-08 DIAGNOSIS — F321 Major depressive disorder, single episode, moderate: Secondary | ICD-10-CM | POA: Diagnosis not present

## 2020-01-16 DIAGNOSIS — F509 Eating disorder, unspecified: Secondary | ICD-10-CM | POA: Diagnosis not present

## 2020-01-16 DIAGNOSIS — F609 Personality disorder, unspecified: Secondary | ICD-10-CM | POA: Diagnosis not present

## 2020-01-16 DIAGNOSIS — F332 Major depressive disorder, recurrent severe without psychotic features: Secondary | ICD-10-CM | POA: Diagnosis not present

## 2020-01-16 DIAGNOSIS — F422 Mixed obsessional thoughts and acts: Secondary | ICD-10-CM | POA: Diagnosis not present

## 2020-01-16 DIAGNOSIS — F321 Major depressive disorder, single episode, moderate: Secondary | ICD-10-CM | POA: Diagnosis not present

## 2020-01-22 DIAGNOSIS — F321 Major depressive disorder, single episode, moderate: Secondary | ICD-10-CM | POA: Diagnosis not present

## 2020-01-30 DIAGNOSIS — F321 Major depressive disorder, single episode, moderate: Secondary | ICD-10-CM | POA: Diagnosis not present

## 2020-02-13 DIAGNOSIS — F321 Major depressive disorder, single episode, moderate: Secondary | ICD-10-CM | POA: Diagnosis not present

## 2020-02-19 DIAGNOSIS — F321 Major depressive disorder, single episode, moderate: Secondary | ICD-10-CM | POA: Diagnosis not present

## 2020-03-06 DIAGNOSIS — F332 Major depressive disorder, recurrent severe without psychotic features: Secondary | ICD-10-CM | POA: Diagnosis not present

## 2020-03-06 DIAGNOSIS — F422 Mixed obsessional thoughts and acts: Secondary | ICD-10-CM | POA: Diagnosis not present

## 2020-03-06 DIAGNOSIS — F509 Eating disorder, unspecified: Secondary | ICD-10-CM | POA: Diagnosis not present

## 2020-03-06 DIAGNOSIS — F609 Personality disorder, unspecified: Secondary | ICD-10-CM | POA: Diagnosis not present

## 2020-03-18 DIAGNOSIS — F321 Major depressive disorder, single episode, moderate: Secondary | ICD-10-CM | POA: Diagnosis not present

## 2020-03-25 DIAGNOSIS — F321 Major depressive disorder, single episode, moderate: Secondary | ICD-10-CM | POA: Diagnosis not present

## 2020-04-01 DIAGNOSIS — F321 Major depressive disorder, single episode, moderate: Secondary | ICD-10-CM | POA: Diagnosis not present

## 2020-04-09 DIAGNOSIS — F321 Major depressive disorder, single episode, moderate: Secondary | ICD-10-CM | POA: Diagnosis not present

## 2020-04-16 DIAGNOSIS — F321 Major depressive disorder, single episode, moderate: Secondary | ICD-10-CM | POA: Diagnosis not present

## 2020-04-22 DIAGNOSIS — F321 Major depressive disorder, single episode, moderate: Secondary | ICD-10-CM | POA: Diagnosis not present

## 2020-04-29 DIAGNOSIS — F321 Major depressive disorder, single episode, moderate: Secondary | ICD-10-CM | POA: Diagnosis not present

## 2020-05-06 DIAGNOSIS — F321 Major depressive disorder, single episode, moderate: Secondary | ICD-10-CM | POA: Diagnosis not present

## 2020-05-07 DIAGNOSIS — F509 Eating disorder, unspecified: Secondary | ICD-10-CM | POA: Diagnosis not present

## 2020-05-07 DIAGNOSIS — F332 Major depressive disorder, recurrent severe without psychotic features: Secondary | ICD-10-CM | POA: Diagnosis not present

## 2020-05-07 DIAGNOSIS — F609 Personality disorder, unspecified: Secondary | ICD-10-CM | POA: Diagnosis not present

## 2020-05-07 DIAGNOSIS — F422 Mixed obsessional thoughts and acts: Secondary | ICD-10-CM | POA: Diagnosis not present

## 2020-05-14 DIAGNOSIS — F321 Major depressive disorder, single episode, moderate: Secondary | ICD-10-CM | POA: Diagnosis not present

## 2020-05-17 DIAGNOSIS — Z6826 Body mass index (BMI) 26.0-26.9, adult: Secondary | ICD-10-CM | POA: Diagnosis not present

## 2020-05-17 DIAGNOSIS — Z01419 Encounter for gynecological examination (general) (routine) without abnormal findings: Secondary | ICD-10-CM | POA: Diagnosis not present

## 2020-05-17 DIAGNOSIS — Z1231 Encounter for screening mammogram for malignant neoplasm of breast: Secondary | ICD-10-CM | POA: Diagnosis not present

## 2020-05-22 DIAGNOSIS — F321 Major depressive disorder, single episode, moderate: Secondary | ICD-10-CM | POA: Diagnosis not present

## 2020-05-27 DIAGNOSIS — F321 Major depressive disorder, single episode, moderate: Secondary | ICD-10-CM | POA: Diagnosis not present

## 2020-06-03 DIAGNOSIS — F321 Major depressive disorder, single episode, moderate: Secondary | ICD-10-CM | POA: Diagnosis not present

## 2020-06-07 DIAGNOSIS — F321 Major depressive disorder, single episode, moderate: Secondary | ICD-10-CM | POA: Diagnosis not present

## 2020-06-17 DIAGNOSIS — F321 Major depressive disorder, single episode, moderate: Secondary | ICD-10-CM | POA: Diagnosis not present

## 2020-07-01 DIAGNOSIS — F321 Major depressive disorder, single episode, moderate: Secondary | ICD-10-CM | POA: Diagnosis not present

## 2020-07-08 DIAGNOSIS — F321 Major depressive disorder, single episode, moderate: Secondary | ICD-10-CM | POA: Diagnosis not present

## 2020-07-15 DIAGNOSIS — F321 Major depressive disorder, single episode, moderate: Secondary | ICD-10-CM | POA: Diagnosis not present

## 2020-07-20 DIAGNOSIS — Z23 Encounter for immunization: Secondary | ICD-10-CM | POA: Diagnosis not present

## 2020-07-29 DIAGNOSIS — F321 Major depressive disorder, single episode, moderate: Secondary | ICD-10-CM | POA: Diagnosis not present

## 2020-08-05 DIAGNOSIS — F321 Major depressive disorder, single episode, moderate: Secondary | ICD-10-CM | POA: Diagnosis not present

## 2020-08-12 DIAGNOSIS — F321 Major depressive disorder, single episode, moderate: Secondary | ICD-10-CM | POA: Diagnosis not present

## 2020-08-21 DIAGNOSIS — F321 Major depressive disorder, single episode, moderate: Secondary | ICD-10-CM | POA: Diagnosis not present

## 2020-08-26 DIAGNOSIS — F321 Major depressive disorder, single episode, moderate: Secondary | ICD-10-CM | POA: Diagnosis not present

## 2020-09-09 DIAGNOSIS — F321 Major depressive disorder, single episode, moderate: Secondary | ICD-10-CM | POA: Diagnosis not present

## 2020-09-13 DIAGNOSIS — H5213 Myopia, bilateral: Secondary | ICD-10-CM | POA: Diagnosis not present

## 2020-09-16 DIAGNOSIS — F321 Major depressive disorder, single episode, moderate: Secondary | ICD-10-CM | POA: Diagnosis not present

## 2020-09-23 DIAGNOSIS — F321 Major depressive disorder, single episode, moderate: Secondary | ICD-10-CM | POA: Diagnosis not present

## 2020-09-24 DIAGNOSIS — F509 Eating disorder, unspecified: Secondary | ICD-10-CM | POA: Diagnosis not present

## 2020-09-24 DIAGNOSIS — F609 Personality disorder, unspecified: Secondary | ICD-10-CM | POA: Diagnosis not present

## 2020-09-24 DIAGNOSIS — F332 Major depressive disorder, recurrent severe without psychotic features: Secondary | ICD-10-CM | POA: Diagnosis not present

## 2020-09-24 DIAGNOSIS — F422 Mixed obsessional thoughts and acts: Secondary | ICD-10-CM | POA: Diagnosis not present

## 2020-09-30 DIAGNOSIS — F321 Major depressive disorder, single episode, moderate: Secondary | ICD-10-CM | POA: Diagnosis not present

## 2020-10-07 DIAGNOSIS — F321 Major depressive disorder, single episode, moderate: Secondary | ICD-10-CM | POA: Diagnosis not present

## 2020-10-14 DIAGNOSIS — F321 Major depressive disorder, single episode, moderate: Secondary | ICD-10-CM | POA: Diagnosis not present

## 2020-10-21 DIAGNOSIS — F321 Major depressive disorder, single episode, moderate: Secondary | ICD-10-CM | POA: Diagnosis not present

## 2020-10-21 DIAGNOSIS — R61 Generalized hyperhidrosis: Secondary | ICD-10-CM | POA: Diagnosis not present

## 2020-10-28 DIAGNOSIS — F321 Major depressive disorder, single episode, moderate: Secondary | ICD-10-CM | POA: Diagnosis not present

## 2020-11-04 DIAGNOSIS — F321 Major depressive disorder, single episode, moderate: Secondary | ICD-10-CM | POA: Diagnosis not present

## 2020-11-11 DIAGNOSIS — F321 Major depressive disorder, single episode, moderate: Secondary | ICD-10-CM | POA: Diagnosis not present

## 2020-12-02 DIAGNOSIS — F321 Major depressive disorder, single episode, moderate: Secondary | ICD-10-CM | POA: Diagnosis not present

## 2020-12-04 ENCOUNTER — Other Ambulatory Visit: Payer: Self-pay

## 2020-12-04 ENCOUNTER — Ambulatory Visit
Admission: RE | Admit: 2020-12-04 | Discharge: 2020-12-04 | Disposition: A | Discharge status: Home / Self Care | Payer: BC Managed Care – PPO | Source: Ambulatory Visit | Attending: Emergency Medicine | Admitting: Emergency Medicine

## 2020-12-04 VITALS — BP 143/92 | HR 72 | Temp 98.5°F | Resp 18

## 2020-12-04 DIAGNOSIS — R002 Palpitations: Secondary | ICD-10-CM

## 2020-12-04 HISTORY — DX: Depression, unspecified: F32.A

## 2020-12-04 NOTE — ED Provider Notes (Signed)
[place]   [id number] 12/04/20 Arrival Time: [place]   CC: Palpitations/ heart racing  SUBJECTIVE:  Lindsay Valencia is a 45 y.o. female who presents with complaint of palpitations x "hours" that occurred last night.  Now resolved, no chest pain.  Was working in garden prior to symptoms.   Has NOT tried OTC medications.  Denies aggravating factors.  Denies radiating symptoms.  Denies previous symptoms in the past.  Denies fever, chills, lightheadedness, dizziness, tachycardia, SOB, nausea, vomiting, abdominal pain, changes in bowel or bladder habits, diaphoresis, numbness/tingling in extremities, peripheral edema, or anxiety.    Denies SOB, calf pain or swelling, recent long travel, recent surgery, pregnancy, malignancy, tobacco use, hormone use, or previous blood clot  Denies close relatives with cardiac hx  Previous cardiac testing: none.  ROS: As per HPI.  All other pertinent ROS negative.    Past Medical History:  Diagnosis Date  . Anxiety   . Carpal tunnel syndrome of right wrist 04/2015  . Dental crowns present   . Depression   . History of kidney stones   . Horner's syndrome   . 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 CARPAL TUNNEL RELEASE Right 04/25/2015   Procedure: RIGHT CARPAL TUNNEL RELEASE;  Surgeon: Daryll Brod, MD;  Location: [place];  Service: Orthopedics;  Laterality: Right;  . CARPAL TUNNEL RELEASE Left 05/23/2015   Procedure: LEFT CARPAL TUNNEL RELEASE;  Surgeon: Daryll Brod, MD;  Location: [place];  Service: Orthopedics;  Laterality: Left;  . EXTRACORPOREAL SHOCK WAVE LITHOTRIPSY Right 12/05/2012  . EXTRACORPOREAL SHOCK WAVE LITHOTRIPSY Left 09/29/2010   Allergies  Allergen Reactions  . Penicillins Rash   No current facility-administered medications on file prior to encounter.   Current Outpatient Medications on File Prior to Encounter  Medication Sig Dispense Refill  .  ALPRAZolam (XANAX) 0.5 MG tablet TK 1/2-1 T UP TO QID PRF AGITATION  0  . amoxicillin-clavulanate (AUGMENTIN) 875-125 MG tablet Take 1 tablet by mouth 2 (two) times daily. 20 tablet 0  . azithromycin (ZITHROMAX) 250 MG tablet Sig as indicated 6 tablet 0  . buPROPion (WELLBUTRIN SR) 150 MG 12 hr tablet TK 1 TABLET BY MOUTH Q AM  3  . clonazePAM (KLONOPIN) 0.5 MG tablet Take 0.5 mg by mouth 2 (two) times daily as needed for anxiety.    [name] levonorgestrel (MIRENA) 20 MCG/24HR IUD 1 each by Intrauterine route once.    . phentermine 15 MG capsule Take 18.5 mg by mouth.    . sertraline (ZOLOFT) 100 MG tablet Take 100 mg by mouth every evening.     . triamcinolone (NASACORT AQ) 55 MCG/ACT AERO nasal inhaler Place 2 sprays into the nose daily. 1 Inhaler 12   Social History   Socioeconomic History  . Marital status: Married    Spouse name: Not on file  . Number of children: Not on file  . Years of education: Not on file  . Highest education level: Not on file  Occupational History  . Not on file  Tobacco Use  . Smoking status: Never Smoker  . Smokeless tobacco: Never Used  Substance and Sexual Activity  . Alcohol use: No  . Drug use: No  . Sexual activity: Not on file  Other Topics Concern  . Not on file  Social History Narrative  . Not on file   Social Determinants of Health   Financial Resource Strain: Not on file  Food Insecurity: Not on file  Transportation Needs: Not on file  Physical Activity: Not on file  Stress: Not on file  Social Connections: Not on file  Intimate Partner Violence: Not on file   Family History  Problem Relation Age of Onset  . Heart disease Maternal Grandmother        pacemaker  . Hypertension Maternal Grandmother   . Hyperlipidemia Mother      OBJECTIVE:  Vitals:   12/04/20 1006  BP: (!) 143/92  Pulse: 72  Resp: 18  Temp: 98.5 F (36.9 C)  TempSrc: Oral  SpO2: 98%    General appearance: alert; no distress Eyes: PERRLA; EOMI; conjunctiva  normal HENT: normocephalic; atraumatic Neck: supple; no carotid bruits Lungs: clear to auscultation bilaterally without adventitious breath sounds Heart: regular rate and rhythm.  Clear S1 and S2 without rubs, gallops, or murmur. Abdomen: soft, non-tender; bowel sounds normal; no guarding Extremities: no cyanosis or edema; symmetrical with no gross deformities; no calf swelling or TTP Skin: warm and dry Psychological: alert and cooperative; normal mood and affect  ECG: Orders placed or performed during the hospital encounter of 12/04/20  . ED EKG  . ED EKG   EKG normal sinus rhythm without ST elevations, depressions, or prolonged PR interval.  No narrowing or widening of the QRS complexes.    ASSESSMENT & PLAN:  1. Heart palpitations     No orders of the defined types were placed in this encounter.  Unable to rule out cardiac disease or blood clot in urgent care setting.  Offered patient further evaluation and management in the ED.  Patient declines at this time and would like to try outpatient therapy first.  Aware of the risk associated with this decision including missed diagnosis, organ damage, organ failure, and/or death.  Patient aware and in agreement.     EKG and exam reassuring Currently asymptomatic  Follow up with cardiology for further evaluation and management  Call 911 or go to the ED if you have chest pain, shortness of breath, numbness/ tingling, slurred speech, facial droop, weakness in arms/ legs, etc...  Chest pain precautions given. Reviewed expectations re: course of current medical issues. Questions answered. Outlined signs and symptoms indicating need for more acute intervention. Patient verbalized understanding. After Visit Summary given.   Lestine Box, PA-C 12/04/20 1048

## 2020-12-04 NOTE — Discharge Instructions (Signed)
Unable to rule out cardiac disease or blood clot in urgent care setting.  Offered patient further evaluation and management in the ED.  Patient declines at this time and would like to try outpatient therapy first.  Aware of the risk associated with this decision including missed diagnosis, organ damage, organ failure, and/or death.  Patient aware and in agreement.     Follow up with cardiology for further evaluation and management  Call 911 or go to the ED if you have chest pain, shortness of breath, numbness/ tingling, slurred speech, facial droop, weakness in arms/ legs, etc..Lindsay Valencia

## 2020-12-04 NOTE — ED Triage Notes (Addendum)
Pt was having palpitations last night for hours.  Has not felt any palpitations this morning.  Denies any pain.

## 2020-12-09 DIAGNOSIS — F321 Major depressive disorder, single episode, moderate: Secondary | ICD-10-CM | POA: Diagnosis not present

## 2020-12-16 DIAGNOSIS — F321 Major depressive disorder, single episode, moderate: Secondary | ICD-10-CM | POA: Diagnosis not present

## 2020-12-23 DIAGNOSIS — F321 Major depressive disorder, single episode, moderate: Secondary | ICD-10-CM | POA: Diagnosis not present

## 2021-01-06 DIAGNOSIS — F321 Major depressive disorder, single episode, moderate: Secondary | ICD-10-CM | POA: Diagnosis not present

## 2021-01-13 DIAGNOSIS — F321 Major depressive disorder, single episode, moderate: Secondary | ICD-10-CM | POA: Diagnosis not present

## 2021-01-20 DIAGNOSIS — F321 Major depressive disorder, single episode, moderate: Secondary | ICD-10-CM | POA: Diagnosis not present

## 2021-01-22 DIAGNOSIS — F3341 Major depressive disorder, recurrent, in partial remission: Secondary | ICD-10-CM | POA: Diagnosis not present

## 2021-01-22 DIAGNOSIS — F609 Personality disorder, unspecified: Secondary | ICD-10-CM | POA: Diagnosis not present

## 2021-01-22 DIAGNOSIS — F509 Eating disorder, unspecified: Secondary | ICD-10-CM | POA: Diagnosis not present

## 2021-01-22 DIAGNOSIS — F422 Mixed obsessional thoughts and acts: Secondary | ICD-10-CM | POA: Diagnosis not present

## 2021-02-04 DIAGNOSIS — F321 Major depressive disorder, single episode, moderate: Secondary | ICD-10-CM | POA: Diagnosis not present

## 2021-02-10 DIAGNOSIS — F321 Major depressive disorder, single episode, moderate: Secondary | ICD-10-CM | POA: Diagnosis not present

## 2021-02-17 DIAGNOSIS — F321 Major depressive disorder, single episode, moderate: Secondary | ICD-10-CM | POA: Diagnosis not present

## 2021-03-10 DIAGNOSIS — F321 Major depressive disorder, single episode, moderate: Secondary | ICD-10-CM | POA: Diagnosis not present

## 2021-03-17 DIAGNOSIS — F321 Major depressive disorder, single episode, moderate: Secondary | ICD-10-CM | POA: Diagnosis not present

## 2021-03-24 DIAGNOSIS — F321 Major depressive disorder, single episode, moderate: Secondary | ICD-10-CM | POA: Diagnosis not present

## 2021-03-31 DIAGNOSIS — F321 Major depressive disorder, single episode, moderate: Secondary | ICD-10-CM | POA: Diagnosis not present

## 2021-04-04 DIAGNOSIS — F332 Major depressive disorder, recurrent severe without psychotic features: Secondary | ICD-10-CM | POA: Diagnosis not present

## 2021-04-08 DIAGNOSIS — F332 Major depressive disorder, recurrent severe without psychotic features: Secondary | ICD-10-CM | POA: Diagnosis not present

## 2021-04-08 DIAGNOSIS — F321 Major depressive disorder, single episode, moderate: Secondary | ICD-10-CM | POA: Diagnosis not present

## 2021-04-09 DIAGNOSIS — F332 Major depressive disorder, recurrent severe without psychotic features: Secondary | ICD-10-CM | POA: Diagnosis not present

## 2021-04-10 DIAGNOSIS — F332 Major depressive disorder, recurrent severe without psychotic features: Secondary | ICD-10-CM | POA: Diagnosis not present

## 2021-04-11 DIAGNOSIS — F332 Major depressive disorder, recurrent severe without psychotic features: Secondary | ICD-10-CM | POA: Diagnosis not present

## 2021-04-14 DIAGNOSIS — F332 Major depressive disorder, recurrent severe without psychotic features: Secondary | ICD-10-CM | POA: Diagnosis not present

## 2021-04-15 DIAGNOSIS — F332 Major depressive disorder, recurrent severe without psychotic features: Secondary | ICD-10-CM | POA: Diagnosis not present

## 2021-04-16 DIAGNOSIS — F332 Major depressive disorder, recurrent severe without psychotic features: Secondary | ICD-10-CM | POA: Diagnosis not present

## 2021-04-17 DIAGNOSIS — F332 Major depressive disorder, recurrent severe without psychotic features: Secondary | ICD-10-CM | POA: Diagnosis not present

## 2021-04-18 DIAGNOSIS — F332 Major depressive disorder, recurrent severe without psychotic features: Secondary | ICD-10-CM | POA: Diagnosis not present

## 2021-04-21 DIAGNOSIS — F332 Major depressive disorder, recurrent severe without psychotic features: Secondary | ICD-10-CM | POA: Diagnosis not present

## 2021-04-22 DIAGNOSIS — F332 Major depressive disorder, recurrent severe without psychotic features: Secondary | ICD-10-CM | POA: Diagnosis not present

## 2021-04-23 DIAGNOSIS — F332 Major depressive disorder, recurrent severe without psychotic features: Secondary | ICD-10-CM | POA: Diagnosis not present

## 2021-04-24 DIAGNOSIS — F332 Major depressive disorder, recurrent severe without psychotic features: Secondary | ICD-10-CM | POA: Diagnosis not present

## 2021-04-25 DIAGNOSIS — F332 Major depressive disorder, recurrent severe without psychotic features: Secondary | ICD-10-CM | POA: Diagnosis not present

## 2021-04-28 DIAGNOSIS — F332 Major depressive disorder, recurrent severe without psychotic features: Secondary | ICD-10-CM | POA: Diagnosis not present

## 2021-04-29 DIAGNOSIS — F332 Major depressive disorder, recurrent severe without psychotic features: Secondary | ICD-10-CM | POA: Diagnosis not present

## 2021-04-30 DIAGNOSIS — F332 Major depressive disorder, recurrent severe without psychotic features: Secondary | ICD-10-CM | POA: Diagnosis not present

## 2021-05-01 DIAGNOSIS — F332 Major depressive disorder, recurrent severe without psychotic features: Secondary | ICD-10-CM | POA: Diagnosis not present

## 2021-05-02 DIAGNOSIS — F332 Major depressive disorder, recurrent severe without psychotic features: Secondary | ICD-10-CM | POA: Diagnosis not present

## 2021-05-05 DIAGNOSIS — F332 Major depressive disorder, recurrent severe without psychotic features: Secondary | ICD-10-CM | POA: Diagnosis not present

## 2021-05-05 DIAGNOSIS — F321 Major depressive disorder, single episode, moderate: Secondary | ICD-10-CM | POA: Diagnosis not present

## 2021-05-06 DIAGNOSIS — F332 Major depressive disorder, recurrent severe without psychotic features: Secondary | ICD-10-CM | POA: Diagnosis not present

## 2021-05-07 DIAGNOSIS — F332 Major depressive disorder, recurrent severe without psychotic features: Secondary | ICD-10-CM | POA: Diagnosis not present

## 2021-05-08 DIAGNOSIS — F332 Major depressive disorder, recurrent severe without psychotic features: Secondary | ICD-10-CM | POA: Diagnosis not present

## 2021-05-09 DIAGNOSIS — F332 Major depressive disorder, recurrent severe without psychotic features: Secondary | ICD-10-CM | POA: Diagnosis not present

## 2021-05-12 DIAGNOSIS — F332 Major depressive disorder, recurrent severe without psychotic features: Secondary | ICD-10-CM | POA: Diagnosis not present

## 2021-05-12 DIAGNOSIS — F321 Major depressive disorder, single episode, moderate: Secondary | ICD-10-CM | POA: Diagnosis not present

## 2021-05-13 DIAGNOSIS — F332 Major depressive disorder, recurrent severe without psychotic features: Secondary | ICD-10-CM | POA: Diagnosis not present

## 2021-05-14 DIAGNOSIS — F332 Major depressive disorder, recurrent severe without psychotic features: Secondary | ICD-10-CM | POA: Diagnosis not present

## 2021-05-15 DIAGNOSIS — F332 Major depressive disorder, recurrent severe without psychotic features: Secondary | ICD-10-CM | POA: Diagnosis not present

## 2021-05-16 DIAGNOSIS — F332 Major depressive disorder, recurrent severe without psychotic features: Secondary | ICD-10-CM | POA: Diagnosis not present

## 2021-05-19 DIAGNOSIS — F321 Major depressive disorder, single episode, moderate: Secondary | ICD-10-CM | POA: Diagnosis not present

## 2021-05-19 DIAGNOSIS — F332 Major depressive disorder, recurrent severe without psychotic features: Secondary | ICD-10-CM | POA: Diagnosis not present

## 2021-05-20 DIAGNOSIS — F332 Major depressive disorder, recurrent severe without psychotic features: Secondary | ICD-10-CM | POA: Diagnosis not present

## 2021-05-21 DIAGNOSIS — F332 Major depressive disorder, recurrent severe without psychotic features: Secondary | ICD-10-CM | POA: Diagnosis not present

## 2021-05-23 DIAGNOSIS — F332 Major depressive disorder, recurrent severe without psychotic features: Secondary | ICD-10-CM | POA: Diagnosis not present

## 2021-05-26 DIAGNOSIS — F332 Major depressive disorder, recurrent severe without psychotic features: Secondary | ICD-10-CM | POA: Diagnosis not present

## 2021-05-26 DIAGNOSIS — F321 Major depressive disorder, single episode, moderate: Secondary | ICD-10-CM | POA: Diagnosis not present

## 2021-05-28 DIAGNOSIS — F332 Major depressive disorder, recurrent severe without psychotic features: Secondary | ICD-10-CM | POA: Diagnosis not present

## 2021-05-30 DIAGNOSIS — F332 Major depressive disorder, recurrent severe without psychotic features: Secondary | ICD-10-CM | POA: Diagnosis not present

## 2021-06-02 DIAGNOSIS — F321 Major depressive disorder, single episode, moderate: Secondary | ICD-10-CM | POA: Diagnosis not present

## 2021-06-02 DIAGNOSIS — F332 Major depressive disorder, recurrent severe without psychotic features: Secondary | ICD-10-CM | POA: Diagnosis not present

## 2021-06-04 DIAGNOSIS — F332 Major depressive disorder, recurrent severe without psychotic features: Secondary | ICD-10-CM | POA: Diagnosis not present

## 2021-06-09 DIAGNOSIS — F321 Major depressive disorder, single episode, moderate: Secondary | ICD-10-CM | POA: Diagnosis not present

## 2021-06-16 DIAGNOSIS — F321 Major depressive disorder, single episode, moderate: Secondary | ICD-10-CM | POA: Diagnosis not present

## 2021-06-23 DIAGNOSIS — F321 Major depressive disorder, single episode, moderate: Secondary | ICD-10-CM | POA: Diagnosis not present

## 2021-06-30 DIAGNOSIS — F321 Major depressive disorder, single episode, moderate: Secondary | ICD-10-CM | POA: Diagnosis not present

## 2021-07-07 DIAGNOSIS — F321 Major depressive disorder, single episode, moderate: Secondary | ICD-10-CM | POA: Diagnosis not present

## 2021-07-14 DIAGNOSIS — F321 Major depressive disorder, single episode, moderate: Secondary | ICD-10-CM | POA: Diagnosis not present

## 2021-07-17 DIAGNOSIS — N926 Irregular menstruation, unspecified: Secondary | ICD-10-CM | POA: Insufficient documentation

## 2021-07-17 DIAGNOSIS — Z8759 Personal history of other complications of pregnancy, childbirth and the puerperium: Secondary | ICD-10-CM | POA: Insufficient documentation

## 2021-07-17 DIAGNOSIS — Z8659 Personal history of other mental and behavioral disorders: Secondary | ICD-10-CM | POA: Insufficient documentation

## 2021-07-17 DIAGNOSIS — N200 Calculus of kidney: Secondary | ICD-10-CM | POA: Insufficient documentation

## 2021-07-17 DIAGNOSIS — D649 Anemia, unspecified: Secondary | ICD-10-CM | POA: Insufficient documentation

## 2021-07-17 DIAGNOSIS — R6882 Decreased libido: Secondary | ICD-10-CM | POA: Insufficient documentation

## 2021-07-18 DIAGNOSIS — Z124 Encounter for screening for malignant neoplasm of cervix: Secondary | ICD-10-CM | POA: Diagnosis not present

## 2021-07-18 DIAGNOSIS — Z30431 Encounter for routine checking of intrauterine contraceptive device: Secondary | ICD-10-CM | POA: Diagnosis not present

## 2021-07-18 DIAGNOSIS — Z01419 Encounter for gynecological examination (general) (routine) without abnormal findings: Secondary | ICD-10-CM | POA: Diagnosis not present

## 2021-07-18 DIAGNOSIS — Z1231 Encounter for screening mammogram for malignant neoplasm of breast: Secondary | ICD-10-CM | POA: Diagnosis not present

## 2021-07-18 DIAGNOSIS — Z6829 Body mass index (BMI) 29.0-29.9, adult: Secondary | ICD-10-CM | POA: Diagnosis not present

## 2021-07-21 DIAGNOSIS — F321 Major depressive disorder, single episode, moderate: Secondary | ICD-10-CM | POA: Diagnosis not present

## 2021-08-11 DIAGNOSIS — F321 Major depressive disorder, single episode, moderate: Secondary | ICD-10-CM | POA: Diagnosis not present

## 2021-08-25 DIAGNOSIS — F321 Major depressive disorder, single episode, moderate: Secondary | ICD-10-CM | POA: Diagnosis not present

## 2021-09-06 DIAGNOSIS — J029 Acute pharyngitis, unspecified: Secondary | ICD-10-CM | POA: Diagnosis not present

## 2021-09-06 DIAGNOSIS — Z209 Contact with and (suspected) exposure to unspecified communicable disease: Secondary | ICD-10-CM | POA: Diagnosis not present

## 2021-09-08 DIAGNOSIS — F321 Major depressive disorder, single episode, moderate: Secondary | ICD-10-CM | POA: Diagnosis not present

## 2021-09-15 DIAGNOSIS — H5213 Myopia, bilateral: Secondary | ICD-10-CM | POA: Diagnosis not present

## 2021-09-15 DIAGNOSIS — F321 Major depressive disorder, single episode, moderate: Secondary | ICD-10-CM | POA: Diagnosis not present

## 2021-09-15 DIAGNOSIS — H524 Presbyopia: Secondary | ICD-10-CM | POA: Diagnosis not present

## 2021-09-22 DIAGNOSIS — F321 Major depressive disorder, single episode, moderate: Secondary | ICD-10-CM | POA: Diagnosis not present

## 2021-09-25 DIAGNOSIS — F3341 Major depressive disorder, recurrent, in partial remission: Secondary | ICD-10-CM | POA: Diagnosis not present

## 2021-09-25 DIAGNOSIS — F509 Eating disorder, unspecified: Secondary | ICD-10-CM | POA: Diagnosis not present

## 2021-09-25 DIAGNOSIS — F422 Mixed obsessional thoughts and acts: Secondary | ICD-10-CM | POA: Diagnosis not present

## 2021-09-29 DIAGNOSIS — F321 Major depressive disorder, single episode, moderate: Secondary | ICD-10-CM | POA: Diagnosis not present

## 2021-10-06 DIAGNOSIS — F321 Major depressive disorder, single episode, moderate: Secondary | ICD-10-CM | POA: Diagnosis not present

## 2021-10-13 DIAGNOSIS — F321 Major depressive disorder, single episode, moderate: Secondary | ICD-10-CM | POA: Diagnosis not present

## 2021-10-20 DIAGNOSIS — F321 Major depressive disorder, single episode, moderate: Secondary | ICD-10-CM | POA: Diagnosis not present

## 2021-10-30 ENCOUNTER — Telehealth: Payer: Self-pay | Admitting: Family Medicine

## 2021-10-30 DIAGNOSIS — M064 Inflammatory polyarthropathy: Secondary | ICD-10-CM | POA: Diagnosis not present

## 2021-10-30 NOTE — Telephone Encounter (Signed)
Willing to take on this patient at this time? ? ?

## 2021-10-30 NOTE — Telephone Encounter (Signed)
Patient called and stated that she wanted to ask if Dr Carlota Raspberry would accept her as a new patient. She stated she had other family members as well that see him and she thought she would ask. I let her know that someone would give her a call with an answer ?

## 2021-10-30 NOTE — Telephone Encounter (Signed)
Sure, I agree to see her.  Okay to schedule new patient appointment.  Thanks ?

## 2021-10-31 ENCOUNTER — Telehealth: Payer: Self-pay

## 2021-10-31 NOTE — Telephone Encounter (Signed)
Error

## 2021-11-01 DIAGNOSIS — F321 Major depressive disorder, single episode, moderate: Secondary | ICD-10-CM | POA: Diagnosis not present

## 2021-11-03 DIAGNOSIS — F321 Major depressive disorder, single episode, moderate: Secondary | ICD-10-CM | POA: Diagnosis not present

## 2021-11-12 DIAGNOSIS — M25561 Pain in right knee: Secondary | ICD-10-CM | POA: Diagnosis not present

## 2021-11-12 DIAGNOSIS — M25562 Pain in left knee: Secondary | ICD-10-CM | POA: Diagnosis not present

## 2021-11-12 DIAGNOSIS — M79642 Pain in left hand: Secondary | ICD-10-CM | POA: Diagnosis not present

## 2021-11-12 DIAGNOSIS — M79641 Pain in right hand: Secondary | ICD-10-CM | POA: Diagnosis not present

## 2021-11-12 DIAGNOSIS — R7982 Elevated C-reactive protein (CRP): Secondary | ICD-10-CM | POA: Diagnosis not present

## 2021-11-12 DIAGNOSIS — M2550 Pain in unspecified joint: Secondary | ICD-10-CM | POA: Diagnosis not present

## 2021-11-15 DIAGNOSIS — F321 Major depressive disorder, single episode, moderate: Secondary | ICD-10-CM | POA: Diagnosis not present

## 2021-11-17 DIAGNOSIS — F321 Major depressive disorder, single episode, moderate: Secondary | ICD-10-CM | POA: Diagnosis not present

## 2021-11-19 ENCOUNTER — Encounter: Payer: Self-pay | Admitting: Family Medicine

## 2021-11-19 ENCOUNTER — Ambulatory Visit: Payer: BC Managed Care – PPO | Admitting: Family Medicine

## 2021-11-19 VITALS — BP 132/76 | HR 78 | Temp 98.1°F | Resp 17 | Ht 64.25 in | Wt 175.2 lb

## 2021-11-19 DIAGNOSIS — Z131 Encounter for screening for diabetes mellitus: Secondary | ICD-10-CM

## 2021-11-19 DIAGNOSIS — Z1211 Encounter for screening for malignant neoplasm of colon: Secondary | ICD-10-CM

## 2021-11-19 DIAGNOSIS — Z1159 Encounter for screening for other viral diseases: Secondary | ICD-10-CM | POA: Diagnosis not present

## 2021-11-19 DIAGNOSIS — Z130 Encounter for screening for diseases of the blood and blood-forming organs and certain disorders involving the immune mechanism: Secondary | ICD-10-CM | POA: Diagnosis not present

## 2021-11-19 DIAGNOSIS — Z13220 Encounter for screening for lipoid disorders: Secondary | ICD-10-CM

## 2021-11-19 DIAGNOSIS — R030 Elevated blood-pressure reading, without diagnosis of hypertension: Secondary | ICD-10-CM | POA: Diagnosis not present

## 2021-11-19 DIAGNOSIS — F418 Other specified anxiety disorders: Secondary | ICD-10-CM | POA: Diagnosis not present

## 2021-11-19 DIAGNOSIS — M2550 Pain in unspecified joint: Secondary | ICD-10-CM

## 2021-11-19 DIAGNOSIS — Z8249 Family history of ischemic heart disease and other diseases of the circulatory system: Secondary | ICD-10-CM | POA: Diagnosis not present

## 2021-11-19 LAB — CBC
HCT: 43.5 % (ref 36.0–46.0)
Hemoglobin: 14.9 g/dL (ref 12.0–15.0)
MCHC: 34.3 g/dL (ref 30.0–36.0)
MCV: 86.5 fl (ref 78.0–100.0)
Platelets: 211 10*3/uL (ref 150.0–400.0)
RBC: 5.03 Mil/uL (ref 3.87–5.11)
RDW: 13.2 % (ref 11.5–15.5)
WBC: 5.1 10*3/uL (ref 4.0–10.5)

## 2021-11-19 LAB — COMPREHENSIVE METABOLIC PANEL
ALT: 16 U/L (ref 0–35)
AST: 17 U/L (ref 0–37)
Albumin: 4.4 g/dL (ref 3.5–5.2)
Alkaline Phosphatase: 73 U/L (ref 39–117)
BUN: 11 mg/dL (ref 6–23)
CO2: 26 mEq/L (ref 19–32)
Calcium: 9.3 mg/dL (ref 8.4–10.5)
Chloride: 104 mEq/L (ref 96–112)
Creatinine, Ser: 0.78 mg/dL (ref 0.40–1.20)
GFR: 90.1 mL/min (ref >60.00)
Glucose, Bld: 95 mg/dL (ref 70–99)
Potassium: 3.6 mEq/L (ref 3.5–5.1)
Sodium: 140 mEq/L (ref 135–145)
Total Bilirubin: 0.6 mg/dL (ref 0.2–1.2)
Total Protein: 7 g/dL (ref 6.0–8.3)

## 2021-11-19 LAB — TSH: TSH: 1.86 u[IU]/mL (ref 0.35–5.50)

## 2021-11-19 LAB — LIPID PANEL
Cholesterol: 220 mg/dL — ABNORMAL HIGH (ref 0–200)
HDL: 54.5 mg/dL (ref >39.00)
LDL Cholesterol: 144 mg/dL — ABNORMAL HIGH (ref 0–99)
NonHDL: 165.69
Total CHOL/HDL Ratio: 4
Triglycerides: 106 mg/dL (ref 0.0–149.0)
VLDL: 21.2 mg/dL (ref 0.0–40.0)

## 2021-11-19 NOTE — Progress Notes (Signed)
? ?Subjective:  ?Patient ID: Lindsay Valencia, female    DOB: 1976/04/20  Age: 46 y.o. MRN: [id number] ? ?CC:  ?Chief Complaint  ?Patient presents with  ? New Patient (Initial Visit)  ?  Has not had a PCP in several years has been seeing Dr Delfino Lovett for OBGYN for care   ? ? ?HPI ?Lindsay Valencia presents for  ?New patient to establish care.  I am also the primary care provider for her spouse, and parents.  ?Has been followed by OB/GYN, Dr. Ronita Hipps.  ?Psychiatry - Dr. Albertine Patricia.  ? ?Depression: ?With anxiety.  Treated with Pristiq 50 mg daily, alprazolam if needed  ?controlled substance database reviewed.  Last prescription for alprazolam 0.5 mg on February 10, 2021, #30.  Most recently clonazepam 0.5 mg #30 on 09/25/2021.  Followed by Dr. Albertine Patricia. ?Klonopin at bedtime.  ?Well controlled.  ? ? ?  11/19/2021  ?  1:15 PM 09/03/2016  ? 11:57 AM 11/25/2015  ?  9:41 AM  ?Depression screen PHQ 2/9  ?Decreased Interest 1 0 0  ?Down, Depressed, Hopeless 0 0 0  ?PHQ - 2 Score 1 0 0  ?Altered sleeping 2    ?Tired, decreased energy 1    ?Change in appetite 0    ?Feeling bad or failure about yourself  0    ?Trouble concentrating 0    ?Moving slowly or fidgety/restless 0    ?Suicidal thoughts 0    ?PHQ-9 Score 4    ? ?Ophthalmology, Dr. Gershon Crane.  History of myopia with presbyopia ? ?Elevated blood pressure noted on visit 10/30/2021, unable to see specific details.  Tried primary care, seen for low energy, knee stiffness affecting stair walking on 10/30/21. Referred to rheumatology - eval last week Dr. Dossie Der.  Sx's subsided. If recurs, plan for acute eval at time of symptoms. Baseline XR obtained.  ?Feeling ok now. BP normal last week.  ?Hx of lyme dz in 2019 - diagnosed by [facility].  ?Various joint pain - doing ok now.  ?FH of HTN - mother and father.  ?No FH of DM, Cancer.  ?Paternal GM with cardiac issue - unknown age.  ?Last weight with noom in past. Lost 33 # - down to 152. Plans on weight loss.  ?Wt Readings from Last 3 Encounters:   ?11/19/21 175 lb 3.2 oz (79.5 kg)  ?09/18/16 163 lb (73.9 kg)  ?09/03/16 162 lb (73.5 kg)  ? ? ?BP Readings from Last 3 Encounters:  ?11/19/21 132/76  ?12/04/20 (!) 143/92  ?09/18/16 134/88  ? ? ?HM: ?No FH of colon CA  - Screening options with colonoscopy versus Cologuard discussed. Discussed timing of repeat testing intervals if normal, as well as potential need for diagnostic Colonoscopy if positive Cologuard. Understanding expressed, and chose Colonoscopy.  ?Pap and mmg in 07/2021 - normal.  ? ? ?History ?Patient Active Problem List  ? Diagnosis Date Noted  ? Acute non-recurrent maxillary sinusitis 09/03/2016  ? 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  ? Carpal tunnel syndrome of right wrist 06/21/2015  ? Depression with anxiety 09/18/2014  ? 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 ?Past Medical History:  ?Diagnosis Date  ? Allergies   ? Anxiety   ? Carpal tunnel syndrome of right wrist 04/2015  ? Dental crowns present   ? Depression   ? History of kidney stones   ? Horner's syndrome   ? 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 ?Past Surgical History:  ?Procedure Laterality Date  ?  CARPAL TUNNEL RELEASE Right 04/25/2015  ? Procedure: RIGHT CARPAL TUNNEL RELEASE;  Surgeon: Daryll Brod, MD;  Location: [facility];  Service: Orthopedics;  Laterality: Right;  ? CARPAL TUNNEL RELEASE Left 05/23/2015  ? Procedure: LEFT CARPAL TUNNEL RELEASE;  Surgeon: Daryll Brod, MD;  Location: [facility];  Service: Orthopedics;  Laterality: Left;  ? EXTRACORPOREAL SHOCK WAVE LITHOTRIPSY Right 12/05/2012  ? EXTRACORPOREAL SHOCK WAVE LITHOTRIPSY Left 09/29/2010  ? ?Allergies  ?Allergen Reactions  ? Penicillins Rash  ? ?Prior to Admission medications   ?Medication Sig Start Date End Date Taking? Authorizing Provider  ?ALPRAZolam (XANAX) 1 MG tablet Take 1 tablet by mouth daily as needed.   Yes [provider]  ?desvenlafaxine (PRISTIQ) 50 MG 24 hr tablet Take 50 mg by mouth daily. 11/19/18  Yes [provider]  ?levonorgestrel (MIRENA) 20 MCG/24HR IUD 1 each by Intrauterine route once.   Yes [provider]  ? ?Social History  ? ?Socioeconomic History  ? Marital status: Married  ?  Spouse name: Not on file  ? Number of children: Not on file  ? Years of education: Not on file  ? Highest education level: Not on file  ?Occupational History  ? Not on file  ?Tobacco Use  ? Smoking status: Never  ? Smokeless tobacco: Never  ?Substance and Sexual Activity  ? Alcohol use: No  ? Drug use: Not Currently  ? Sexual activity: Yes  ?Other Topics Concern  ? Not on file  ?Social History Narrative  ? Not on file  ? ?Social Determinants of Health  ? ?Financial Resource Strain: Not on file  ?Food Insecurity: Not on file  ?Transportation Needs: Not on file  ?Physical Activity: Not on file  ?Stress: Not on file  ?Social Connections: Not on file  ?Intimate Partner Violence: Not on file  ? ? ?Review of Systems  ?Constitutional:  Negative for fatigue and unexpected weight change.  ?Respiratory:  Negative for chest tightness and shortness of breath.   ?Cardiovascular:  Negative for chest pain, palpitations and leg swelling.  ?Gastrointestinal:  Negative for abdominal pain and blood in stool.  ?Neurological:  Negative for dizziness, syncope, light-headedness and headaches.  ? ? ?Objective:  ? ?Vitals:  ? 11/19/21 1310  ?BP: 132/76  ?Pulse: 78  ?Resp: 17  ?Temp: 98.1 ?F (36.7 ?C)  ?TempSrc: Temporal  ?SpO2: 98%  ?Weight: 175 lb 3.2 oz (79.5 kg)  ?Height: 5' 4.25" (1.632 m)  ? ? ? ?Physical Exam ?Vitals reviewed.  ?Constitutional:   ?   Appearance: Normal appearance. She is well-developed.  ?HENT:  ?   Head: Normocephalic and atraumatic.  ?Eyes:  ?   Conjunctiva/sclera: Conjunctivae normal.  ?   Pupils: Pupils are equal, round, and reactive to light.  ?Neck:  ?   Vascular: No carotid bruit.  ?Cardiovascular:  ?   Rate and Rhythm: Normal rate and regular rhythm.  ?   Heart sounds: Normal heart sounds.  ?Pulmonary:  ?   Effort:  Pulmonary effort is normal.  ?   Breath sounds: Normal breath sounds.  ?Abdominal:  ?   Palpations: Abdomen is soft. There is no pulsatile mass.  ?   Tenderness: There is no abdominal tenderness.  ?Musculoskeletal:  ?   Right lower leg: No edema.  ?   Left lower leg: No edema.  ?Skin: ?   General: Skin is warm and dry.  ?Neurological:  ?   Mental Status: She is alert and oriented to  person, place, and time.  ?Psychiatric:     ?   Mood and Affect: Mood normal.     ?   Behavior: Behavior normal.  ? ? ?Assessment & Plan:  ?Lindsay Valencia is a 46 y.o. female . ?Depression with anxiety ? - followed by psychiatry.  ? ?Elevated blood pressure reading - Plan: Comprehensive metabolic panel, CBC ? -Home monitoring with RTC precautions if elevated.  Plans on weight loss.  Likely will help as well.  Screening labs obtained ? ?Screening for diabetes mellitus - Plan: Comprehensive metabolic panel ? ?Screening, anemia, deficiency, iron ? ?Family history of hypertension - Plan: TSH, Comprehensive metabolic panel, CBC ?As above, home monitoring, screening labs, RTC precautions and recheck at physical in 6 months. ? ?Screening for hyperlipidemia - Plan: Lipid panel ? ?Polyarthralgia ?Now improved.  Reported history of Lyme disease as above.  Asymptomatic currently.  Has established with rheumatology and if recurrence of arthralgias plans on acute visit. ? ?Screen for colon cancer - Plan: Ambulatory referral to Gastroenterology ? ?Need for hepatitis C screening test - Plan: Hepatitis C antibody ? ? ?No orders of the defined types were placed in this encounter. ? ?Patient Instructions  ?Keep a record of your blood pressures outside of the office and if over 140/90 be seen.  ?Can recheck levels next visit as well.  ?If any concerns on labs I will let you know.  Follow-up in 6 months for physical but let me know if there are questions sooner.  ? ?How to Take Your Blood Pressure ?Blood pressure is a measurement of how strongly your blood  is pressing against the walls of your arteries. Arteries are blood vessels that carry blood from your heart throughout your body. Your health care provider takes your blood pressure at each office visit

## 2021-11-19 NOTE — Patient Instructions (Addendum)
Keep a record of your blood pressures outside of the office and if over 140/90 be seen.  ?Can recheck levels next visit as well.  ?If any concerns on labs I will let you know.  Follow-up in 6 months for physical but let me know if there are questions sooner.  ? ?How to Take Your Blood Pressure ?Blood pressure is a measurement of how strongly your blood is pressing against the walls of your arteries. Arteries are blood vessels that carry blood from your heart throughout your body. Your health care provider takes your blood pressure at each office visit. You can also take your own blood pressure at home with a blood pressure monitor. ?You may need to take your own blood pressure to: ?Confirm a diagnosis of high blood pressure (hypertension). ?Monitor your blood pressure over time. ?Make sure your blood pressure medicine is working. ?Supplies needed: ?Blood pressure monitor. ?A chair to sit in. This should be a chair where you can sit upright with your back supported. Do not sit on a soft couch or an armchair. ?Table or desk. ?Small notebook and pencil or pen. ?How to prepare ?To get the most accurate reading, avoid the following for 30 minutes before you check your blood pressure: ?Drinking caffeine. ?Drinking alcohol. ?Eating. ?Smoking. ?Exercising. ?Five minutes before you check your blood pressure: ?Use the bathroom and urinate so that you have an empty bladder. ?Sit quietly in a chair. Do not talk. ?How to take your blood pressure ?To check your blood pressure, follow the instructions in the manual that came with your blood pressure monitor. If you have a digital blood pressure monitor, the instructions may be as follows: ?Sit up straight in a chair. ?Place your feet on the floor. Do not cross your ankles or legs. ?Rest your left arm at the level of your heart on a table or desk or on the arm of a chair. ?Pull up your shirt sleeve. ?Wrap the blood pressure cuff around the upper part of your left arm, 1 inch (2.5  cm) above your elbow. It is best to wrap the cuff around bare skin. ?Fit the cuff snugly, but not too tightly, around your arm. You should be able to place only one finger between the cuff and your arm. ?Position the cord so that it rests in the bend of your elbow. ?Press the power button. ?Sit quietly while the cuff inflates and deflates. ?Read the digital reading on the monitor screen and write the numbers down (record them) in a notebook. ?Wait 2-3 minutes, then repeat the steps, starting at step 1. ?What does my blood pressure reading mean? ?A blood pressure reading consists of a higher number over a lower number. Ideally, your blood pressure should be below 120/80. The first ("top") number is called the systolic pressure. It is a measure of the pressure in your arteries as your heart beats. The second ("bottom") number is called the diastolic pressure. It is a measure of the pressure in your arteries as the heart relaxes. ?Blood pressure is classified into four stages. The following are the stages for adults who do not have a short-term serious illness or a chronic condition. Systolic pressure and diastolic pressure are measured in a unit called mm Hg (millimeters of mercury).  ?Normal ?Systolic pressure: below 267. ?Diastolic pressure: below 80. ?Elevated ?Systolic pressure: 124-580. ?Diastolic pressure: below 80. ?Hypertension stage 1 ?Systolic pressure: 998-338. ?Diastolic pressure: 25-05. ?Hypertension stage 2 ?Systolic pressure: 397 or above. ?Diastolic pressure: 90 or above. ?  You can have elevated blood pressure or hypertension even if only the systolic or only the diastolic number in your reading is higher than normal. ?Follow these instructions at home: ?Medicines ?Take over-the-counter and prescription medicines only as told by your health care provider. ?Tell your health care provider if you are having any side effects from blood pressure medicine. ?General instructions ?Check your blood pressure as  often as recommended by your health care provider. ?Check your blood pressure at the same time every day. ?Take your monitor to the next appointment with your health care provider to make sure that: ?You are using it correctly. ?It provides accurate readings. ?Understand what your goal blood pressure numbers are. ?Keep all follow-up visits. This is important. ?General tips ?Your health care provider can suggest a reliable monitor that will meet your needs. There are several types of home blood pressure monitors. ?Choose a monitor that has an arm cuff. Do not choose a monitor that measures your blood pressure from your wrist or finger. ?Choose a cuff that wraps snugly, not too tight or too loose, around your upper arm. You should be able to fit only one finger between your arm and the cuff. ?You can buy a blood pressure monitor at most drugstores or online. ?Where to find more information ?American Heart Association: www.heart.org ?Contact a health care provider if: ?Your blood pressure is consistently high. ?Your blood pressure is suddenly low. ?Get help right away if: ?Your systolic blood pressure is higher than 180. ?Your diastolic blood pressure is higher than 120. ?These symptoms may be an emergency. Get help right away. Call 911. ?Do not wait to see if the symptoms will go away. ?Do not drive yourself to the hospital. ?Summary ?Blood pressure is a measurement of how strongly your blood is pressing against the walls of your arteries. ?A blood pressure reading consists of a higher number over a lower number. Ideally, your blood pressure should be below 120/80. ?Check your blood pressure at the same time every day. ?Avoid caffeine, alcohol, smoking, and exercise for 30 minutes prior to checking your blood pressure. These agents can affect the accuracy of the blood pressure reading. ?This information is not intended to replace advice given to you by your health care provider. Make sure you discuss any questions you  have with your health care provider. ?Document Revised: 04/03/2021 Document Reviewed: 04/03/2021 ?Elsevier Patient Education ? [place]. ? ?

## 2021-11-20 ENCOUNTER — Encounter: Payer: BC Managed Care – PPO | Admitting: Internal Medicine

## 2021-11-20 ENCOUNTER — Encounter: Payer: Self-pay | Admitting: Gastroenterology

## 2021-11-20 LAB — HEPATITIS C ANTIBODY
Hepatitis C Ab: NONREACTIVE
SIGNAL TO CUT-OFF: 0.11 (ref <1.00)

## 2021-11-25 ENCOUNTER — Ambulatory Visit (AMBULATORY_SURGERY_CENTER): Payer: BC Managed Care – PPO | Admitting: *Deleted

## 2021-11-25 VITALS — Ht 64.0 in | Wt 175.0 lb

## 2021-11-25 DIAGNOSIS — Z1211 Encounter for screening for malignant neoplasm of colon: Secondary | ICD-10-CM

## 2021-11-25 MED ORDER — NA SULFATE-K SULFATE-MG SULF 17.5-3.13-1.6 GM/177ML PO SOLN: 1.0000 | Freq: Once | ORAL | 0 refills | Status: AC

## 2021-11-25 NOTE — Progress Notes (Signed)

## 2021-12-01 DIAGNOSIS — F321 Major depressive disorder, single episode, moderate: Secondary | ICD-10-CM | POA: Diagnosis not present

## 2021-12-08 DIAGNOSIS — F321 Major depressive disorder, single episode, moderate: Secondary | ICD-10-CM | POA: Diagnosis not present

## 2021-12-15 DIAGNOSIS — F321 Major depressive disorder, single episode, moderate: Secondary | ICD-10-CM | POA: Diagnosis not present

## 2021-12-17 ENCOUNTER — Encounter: Payer: Self-pay | Admitting: Gastroenterology

## 2021-12-22 DIAGNOSIS — F321 Major depressive disorder, single episode, moderate: Secondary | ICD-10-CM | POA: Diagnosis not present

## 2021-12-23 ENCOUNTER — Encounter: Payer: Self-pay | Admitting: Gastroenterology

## 2021-12-23 ENCOUNTER — Ambulatory Visit (AMBULATORY_SURGERY_CENTER): Payer: BC Managed Care – PPO | Admitting: Gastroenterology

## 2021-12-23 VITALS — BP 128/80 | HR 73 | Temp 97.5°F | Resp 12 | Ht 64.25 in | Wt 175.0 lb

## 2021-12-23 DIAGNOSIS — Z1211 Encounter for screening for malignant neoplasm of colon: Secondary | ICD-10-CM

## 2021-12-23 DIAGNOSIS — K635 Polyp of colon: Secondary | ICD-10-CM | POA: Diagnosis not present

## 2021-12-23 DIAGNOSIS — D122 Benign neoplasm of ascending colon: Secondary | ICD-10-CM

## 2021-12-23 DIAGNOSIS — D123 Benign neoplasm of transverse colon: Secondary | ICD-10-CM

## 2021-12-23 DIAGNOSIS — D120 Benign neoplasm of cecum: Secondary | ICD-10-CM

## 2021-12-23 MED ORDER — SODIUM CHLORIDE 0.9 % IV SOLN: 500.0000 mL | Freq: Once | INTRAVENOUS | Status: DC

## 2021-12-23 NOTE — Patient Instructions (Signed)
Follow a high fiber diet. Use FiberCon 1-2 tablets daily. Resume previous medications. Awaiting pathology results. Repeat Colonoscopy in 3 years for surveillance.  YOU HAD AN ENDOSCOPIC PROCEDURE TODAY AT [facility]:   Refer to the procedure report that was given to you for any specific questions about what was found during the examination.  If the procedure report does not answer your questions, please call your gastroenterologist to clarify.  If you requested that your care partner not be given the details of your procedure findings, then the procedure report has been included in a sealed envelope for you to review at your convenience later.  YOU SHOULD EXPECT: Some feelings of bloating in the abdomen. Passage of more gas than usual.  Walking can help get rid of the air that was put into your GI tract during the procedure and reduce the bloating. If you had a lower endoscopy (such as a colonoscopy or flexible sigmoidoscopy) you may notice spotting of blood in your stool or on the toilet paper. If you underwent a bowel prep for your procedure, you may not have a normal bowel movement for a few days.  Please Note:  You might notice some irritation and congestion in your nose or some drainage.  This is from the oxygen used during your procedure.  There is no need for concern and it should clear up in a day or so.  SYMPTOMS TO REPORT IMMEDIATELY:  Following lower endoscopy (colonoscopy or flexible sigmoidoscopy):  Excessive amounts of blood in the stool  Significant tenderness or worsening of abdominal pains  Swelling of the abdomen that is new, acute  Fever of 100F or higher  For urgent or emergent issues, a gastroenterologist can be reached at any hour by calling [phone]. Do not use MyChart messaging for urgent concerns.    DIET:  We do recommend a small meal at first, but then you may proceed to your regular diet.  Drink plenty of fluids but you should avoid alcoholic  beverages for 24 hours.  ACTIVITY:  You should plan to take it easy for the rest of today and you should NOT DRIVE or use heavy machinery until tomorrow (because of the sedation medicines used during the test).    FOLLOW UP: Our staff will call the number listed on your records 48-72 hours following your procedure to check on you and address any questions or concerns that you may have regarding the information given to you following your procedure. If we do not reach you, we will leave a message.  We will attempt to reach you two times.  During this call, we will ask if you have developed any symptoms of COVID 19. If you develop any symptoms (ie: fever, flu-like symptoms, shortness of breath, cough etc.) before then, please call [phone].  If you test positive for Covid 19 in the 2 weeks post procedure, please call and report this information to [place].    If any biopsies were taken you will be contacted by phone or by letter within the next 1-3 weeks.  Please call us at [phone] if you have not heard about the biopsies in 3 weeks.    SIGNATURES/CONFIDENTIALITY: You and/or your care partner have signed paperwork which will be entered into your electronic medical record.  These signatures attest to the fact that that the information above on your After Visit Summary has been reviewed and is understood.  Full responsibility of the confidentiality of this discharge information lies with  you and/or your care-partner.

## 2021-12-23 NOTE — Progress Notes (Signed)
VS completed by CW.   Pt's states no medical or surgical changes since previsit or office visit.  

## 2021-12-23 NOTE — Progress Notes (Signed)
Pt non-responsive, VVS, Report to RN  °

## 2021-12-23 NOTE — Op Note (Signed)
[name] Procedure Date: 12/23/2021 11:03 AM MRN: [id number] Endoscopist: Justice Britain , MD Age: 46 Referring MD:  Date of Birth: 1975/11/18 Gender: Female Account #: [id number] Procedure:                Colonoscopy Indications:              Screening for colorectal malignant neoplasm, This                            is the patient's first colonoscopy Medicines:                Monitored Anesthesia Care Procedure:                Pre-Anesthesia Assessment:                           - Prior to the procedure, a History and Physical                            was performed, and patient medications and                            allergies were reviewed. The patient's tolerance of                            previous anesthesia was also reviewed. The risks                            and benefits of the procedure and the sedation                            options and risks were discussed with the patient.                            All questions were answered, and informed consent                            was obtained. Prior Anticoagulants: The patient has                            taken no previous anticoagulant or antiplatelet                            agents. ASA Grade Assessment: II - A patient with                            mild systemic disease. After reviewing the risks                            and benefits, the patient was deemed in                            satisfactory condition to undergo the procedure.  After obtaining informed consent, the colonoscope                            was passed under direct vision. Throughout the                            procedure, the patient's blood pressure, pulse, and                            oxygen saturations were monitored continuously. The                            CF [id number] #[id number] was introduced through the anus                            and advanced to the  5 cm into the ileum. The                            colonoscopy was somewhat difficult due to                            significant looping. Successful completion of the                            procedure was aided by changing the patient's                            position, using manual pressure, straightening and                            shortening the scope to obtain bowel loop reduction                            and using scope torsion. The patient tolerated the                            procedure. The quality of the bowel preparation was                            good. The terminal ileum, ileocecal valve,                            appendiceal orifice, and rectum were photographed. Scope In: 11:08:57 AM Scope Out: 11:27:47 AM Scope Withdrawal Time: 0 hours 12 minutes 38 seconds  Total Procedure Duration: 0 hours 18 minutes 50 seconds  Findings:                 The digital rectal exam findings include                            hemorrhoids. Pertinent negatives include no                            palpable rectal lesions.  The colon (entire examined portion) revealed                            significantly excessive looping.                           The terminal ileum and ileocecal valve appeared                            normal.                           Five sessile polyps were found in the hepatic                            flexure (1), ascending colon (3) and cecum (1). The                            polyps were 4 to 12 mm in size. These polyps were                            removed with a cold snare. Resection and retrieval                            were complete.                           Normal mucosa was found in the entire colon                            otherwise.                           Non-bleeding non-thrombosed external and internal                            hemorrhoids were found during retroflexion, during                             perianal exam and during digital exam. The                            hemorrhoids were Grade II (internal hemorrhoids                            that prolapse but reduce spontaneously). Complications:            No immediate complications. Estimated Blood Loss:     Estimated blood loss was minimal. Impression:               - Hemorrhoids found on digital rectal exam.                           - There was significant looping of the colon.                           -  The examined portion of the ileum was normal.                           - Five 4 to 12 mm polyps at the hepatic flexure, in                            the ascending colon and in the cecum, removed with                            a cold snare. Resected and retrieved.                           - Normal mucosa in the entire examined colon                            otherwise.                           - Non-bleeding non-thrombosed external and internal                            hemorrhoids. Recommendation:           - The patient will be observed post-procedure,                            until all discharge criteria are met.                           - Discharge patient to home.                           - Patient has a contact number available for                            emergencies. The signs and symptoms of potential                            delayed complications were discussed with the                            patient. Return to normal activities tomorrow.                            Written discharge instructions were provided to the                            patient.                           - High fiber diet.                           - Use FiberCon 1-2 tablets PO daily.                           -  Continue present medications.                           - Await pathology results.                           - Repeat colonoscopy in 3 years for surveillance                            based on pathology  results.                           - The findings and recommendations were discussed                            with the patient.                           - The findings and recommendations were discussed                            with the patient's family. Justice Britain, MD 12/23/2021 11:36:56 AM

## 2021-12-23 NOTE — Progress Notes (Signed)
GASTROENTEROLOGY PROCEDURE H&P NOTE   Primary Care Physician: Brien Few, MD  HPI: Lindsay Valencia is a 46 y.o. female who presents for Colonoscopy for screening.  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   Past Surgical History:  Procedure Laterality Date   CARPAL TUNNEL RELEASE Right 04/25/2015   Procedure: RIGHT CARPAL TUNNEL RELEASE;  Surgeon: Daryll Brod, MD;  Location: [facility];  Service: Orthopedics;  Laterality: Right;   CARPAL TUNNEL RELEASE Left 05/23/2015   Procedure: LEFT CARPAL TUNNEL RELEASE;  Surgeon: Daryll Brod, MD;  Location: [facility];  Service: Orthopedics;  Laterality: Left;   EXTRACORPOREAL SHOCK WAVE LITHOTRIPSY Right 12/05/2012   EXTRACORPOREAL SHOCK WAVE LITHOTRIPSY Left 09/29/2010   EYE SURGERY     FOR HORNER'S SYNDROME   Current Outpatient Medications  Medication Sig Dispense Refill   clonazePAM (KLONOPIN) 0.5 MG tablet Take 0.25 mg by mouth as needed for anxiety.     desvenlafaxine (PRISTIQ) 50 MG 24 hr tablet Take 50 mg by mouth daily.     Multiple Vitamins-Minerals (MULTIVITAMIN GUMMIES ADULT PO) Take by mouth. VITA FUSION TAKE 2 GUMMIES DAILY     OVER THE COUNTER MEDICATION daily. TAKE 500 MG-TAKE 2 GUMMIES DAILY-TUMERIC     ALPRAZolam (XANAX) 1 MG tablet Take 1 tablet by mouth daily as needed. (Patient not taking: Reported on 11/25/2021)     levonorgestrel (MIRENA) 20 MCG/24HR IUD 1 each by Intrauterine route once.     Current Facility-Administered Medications  Medication Dose Route Frequency Provider Last Rate Last Admin   0.9 %  sodium chloride infusion  500 mL Intravenous Once Mansouraty, Telford Nab., MD        Current Outpatient Medications:    clonazePAM (KLONOPIN) 0.5 MG tablet, Take 0.25 mg by mouth  as needed for anxiety., Disp: , Rfl:    desvenlafaxine (PRISTIQ) 50 MG 24 hr tablet, Take 50 mg by mouth daily., Disp: , Rfl:    Multiple Vitamins-Minerals (MULTIVITAMIN GUMMIES ADULT PO), Take by mouth. VITA FUSION TAKE 2 GUMMIES DAILY, Disp: , Rfl:    OVER THE COUNTER MEDICATION, daily. TAKE 500 MG-TAKE 2 GUMMIES DAILY-TUMERIC, Disp: , Rfl:    ALPRAZolam (XANAX) 1 MG tablet, Take 1 tablet by mouth daily as needed. (Patient not taking: Reported on 11/25/2021), Disp: , Rfl:    levonorgestrel (MIRENA) 20 MCG/24HR IUD, 1 each by Intrauterine route once., Disp: , Rfl:   Current Facility-Administered Medications:    0.9 %  sodium chloride infusion, 500 mL, Intravenous, Once, Mansouraty, Telford Nab., MD Allergies  Allergen Reactions   Penicillins Rash   Family History  Problem Relation Age of Onset   Colon polyps Mother    Hypertension Mother    Hyperlipidemia Mother    Hypertension Father    Heart disease Maternal Grandmother        pacemaker   Hypertension Maternal Grandmother    Colon cancer Neg Hx    Crohn's disease Neg Hx    Esophageal cancer Neg Hx    Rectal cancer Neg Hx    Stomach cancer Neg Hx    Social History   Socioeconomic History   Marital status: Married    Spouse name: Not on file  Number of children: Not on file   Years of education: Not on file   Highest education level: Not on file  Occupational History   Not on file  Tobacco Use   Smoking status: Never   Smokeless tobacco: Never  Vaping Use   Vaping Use: Never used  Substance and Sexual Activity   Alcohol use: No   Drug use: Not Currently   Sexual activity: Yes  Other Topics Concern   Not on file  Social History Narrative   Not on file   Social Determinants of Health   Financial Resource Strain: Not on file  Food Insecurity: Not on file  Transportation Needs: Not on file  Physical Activity: Not on file  Stress: Not on file  Social Connections: Not on file  Intimate Partner Violence: Not on  file    Physical Exam: Today's Vitals   12/23/21 1044  BP: (!) 160/102  Pulse: 88  Temp: (!) 97.5 F (36.4 C)  TempSrc: Temporal  SpO2: 99%  Weight: 175 lb (79.4 kg)  Height: 5' 4.25" (1.632 m)   Body mass index is 29.81 kg/m. GEN: NAD EYE: Sclerae anicteric ENT: MMM CV: Non-tachycardic GI: Soft, NT/ND NEURO:  Alert & Oriented x 3  Lab Results: No results for input(s): WBC, HGB, HCT, PLT in the last 72 hours. BMET No results for input(s): NA, K, CL, CO2, GLUCOSE, BUN, CREATININE, CALCIUM in the last 72 hours. LFT No results for input(s): PROT, ALBUMIN, AST, ALT, ALKPHOS, BILITOT, BILIDIR, IBILI in the last 72 hours. PT/INR No results for input(s): LABPROT, INR in the last 72 hours.   Impression / Plan: This is a 46 y.o.female who presents for Colonoscopy for screening.  The risks and benefits of endoscopic evaluation/treatment were discussed with the patient and/or family; these include but are not limited to the risk of perforation, infection, bleeding, missed lesions, lack of diagnosis, severe illness requiring hospitalization, as well as anesthesia and sedation related illnesses.  The patient's history has been reviewed, patient examined, no change in status, and deemed stable for procedure.  The patient and/or family is agreeable to proceed.    Justice Britain, MD [facility]oscopy Office # [phone]

## 2021-12-23 NOTE — Progress Notes (Signed)
Called to room to assist during endoscopic procedure.  Patient ID and intended procedure confirmed with present staff. Received instructions for my participation in the procedure from the performing physician.  

## 2021-12-24 ENCOUNTER — Telehealth: Payer: Self-pay | Admitting: *Deleted

## 2021-12-24 NOTE — Telephone Encounter (Signed)
  Follow up Call-     12/23/2021   10:45 AM  Call back number  Post procedure Call Back phone  # [phone]  Permission to leave phone message Yes     Patient questions:  Do you have a fever, pain , or abdominal swelling? No. Pain Score  0 *  Have you tolerated food without any problems? Yes.    Have you been able to return to your normal activities? Yes.    Do you have any questions about your discharge instructions: Diet   No. Medications  No. Follow up visit  No.  Do you have questions or concerns about your Care? No.  Actions: * If pain score is 4 or above: No action needed, pain <4.

## 2021-12-27 ENCOUNTER — Encounter: Payer: Self-pay | Admitting: Gastroenterology

## 2021-12-29 DIAGNOSIS — F321 Major depressive disorder, single episode, moderate: Secondary | ICD-10-CM | POA: Diagnosis not present

## 2022-01-12 DIAGNOSIS — F321 Major depressive disorder, single episode, moderate: Secondary | ICD-10-CM | POA: Diagnosis not present

## 2022-01-19 DIAGNOSIS — F321 Major depressive disorder, single episode, moderate: Secondary | ICD-10-CM | POA: Diagnosis not present

## 2022-01-26 DIAGNOSIS — F321 Major depressive disorder, single episode, moderate: Secondary | ICD-10-CM | POA: Diagnosis not present

## 2022-02-09 DIAGNOSIS — F321 Major depressive disorder, single episode, moderate: Secondary | ICD-10-CM | POA: Diagnosis not present

## 2022-03-02 DIAGNOSIS — F321 Major depressive disorder, single episode, moderate: Secondary | ICD-10-CM | POA: Diagnosis not present

## 2022-03-09 DIAGNOSIS — F321 Major depressive disorder, single episode, moderate: Secondary | ICD-10-CM | POA: Diagnosis not present

## 2022-03-16 DIAGNOSIS — F321 Major depressive disorder, single episode, moderate: Secondary | ICD-10-CM | POA: Diagnosis not present

## 2022-03-23 DIAGNOSIS — F321 Major depressive disorder, single episode, moderate: Secondary | ICD-10-CM | POA: Diagnosis not present

## 2022-03-25 DIAGNOSIS — F422 Mixed obsessional thoughts and acts: Secondary | ICD-10-CM | POA: Diagnosis not present

## 2022-03-25 DIAGNOSIS — F609 Personality disorder, unspecified: Secondary | ICD-10-CM | POA: Diagnosis not present

## 2022-03-25 DIAGNOSIS — F3341 Major depressive disorder, recurrent, in partial remission: Secondary | ICD-10-CM | POA: Diagnosis not present

## 2022-03-25 DIAGNOSIS — F509 Eating disorder, unspecified: Secondary | ICD-10-CM | POA: Diagnosis not present

## 2022-03-30 DIAGNOSIS — F321 Major depressive disorder, single episode, moderate: Secondary | ICD-10-CM | POA: Diagnosis not present

## 2022-04-07 DIAGNOSIS — F321 Major depressive disorder, single episode, moderate: Secondary | ICD-10-CM | POA: Diagnosis not present

## 2022-04-13 DIAGNOSIS — F321 Major depressive disorder, single episode, moderate: Secondary | ICD-10-CM | POA: Diagnosis not present

## 2022-04-20 DIAGNOSIS — F321 Major depressive disorder, single episode, moderate: Secondary | ICD-10-CM | POA: Diagnosis not present

## 2022-04-27 DIAGNOSIS — F321 Major depressive disorder, single episode, moderate: Secondary | ICD-10-CM | POA: Diagnosis not present

## 2022-05-04 DIAGNOSIS — F321 Major depressive disorder, single episode, moderate: Secondary | ICD-10-CM | POA: Diagnosis not present

## 2022-05-11 DIAGNOSIS — F321 Major depressive disorder, single episode, moderate: Secondary | ICD-10-CM | POA: Diagnosis not present

## 2022-05-18 DIAGNOSIS — F321 Major depressive disorder, single episode, moderate: Secondary | ICD-10-CM | POA: Diagnosis not present

## 2022-05-20 ENCOUNTER — Ambulatory Visit (INDEPENDENT_AMBULATORY_CARE_PROVIDER_SITE_OTHER): Payer: BC Managed Care – PPO | Admitting: Family Medicine

## 2022-05-20 ENCOUNTER — Encounter: Payer: Self-pay | Admitting: Family Medicine

## 2022-05-20 VITALS — BP 144/92 | HR 71 | Temp 98.2°F | Ht 64.0 in | Wt 180.0 lb

## 2022-05-20 DIAGNOSIS — Z23 Encounter for immunization: Secondary | ICD-10-CM | POA: Diagnosis not present

## 2022-05-20 DIAGNOSIS — Z0001 Encounter for general adult medical examination with abnormal findings: Secondary | ICD-10-CM | POA: Diagnosis not present

## 2022-05-20 DIAGNOSIS — Z0000 Encounter for general adult medical examination without abnormal findings: Secondary | ICD-10-CM

## 2022-05-20 DIAGNOSIS — I10 Essential (primary) hypertension: Secondary | ICD-10-CM

## 2022-05-20 DIAGNOSIS — R600 Localized edema: Secondary | ICD-10-CM | POA: Diagnosis not present

## 2022-05-20 LAB — LIPID PANEL
Cholesterol: 195 mg/dL (ref 0–200)
HDL: 54.3 mg/dL (ref >39.00)
LDL Cholesterol: 122 mg/dL — ABNORMAL HIGH (ref 0–99)
NonHDL: 140.65
Total CHOL/HDL Ratio: 4
Triglycerides: 93 mg/dL (ref 0.0–149.0)
VLDL: 18.6 mg/dL (ref 0.0–40.0)

## 2022-05-20 LAB — COMPREHENSIVE METABOLIC PANEL
ALT: 12 U/L (ref 0–35)
AST: 15 U/L (ref 0–37)
Albumin: 4.6 g/dL (ref 3.5–5.2)
Alkaline Phosphatase: 65 U/L (ref 39–117)
BUN: 11 mg/dL (ref 6–23)
CO2: 31 mEq/L (ref 19–32)
Calcium: 9.7 mg/dL (ref 8.4–10.5)
Chloride: 101 mEq/L (ref 96–112)
Creatinine, Ser: 0.85 mg/dL (ref 0.40–1.20)
GFR: 80.99 mL/min (ref >60.00)
Glucose, Bld: 103 mg/dL — ABNORMAL HIGH (ref 70–99)
Potassium: 3.7 mEq/L (ref 3.5–5.1)
Sodium: 138 mEq/L (ref 135–145)
Total Bilirubin: 0.5 mg/dL (ref 0.2–1.2)
Total Protein: 7.4 g/dL (ref 6.0–8.3)

## 2022-05-20 LAB — CBC
HCT: 45.6 % (ref 36.0–46.0)
Hemoglobin: 15.5 g/dL — ABNORMAL HIGH (ref 12.0–15.0)
MCHC: 33.9 g/dL (ref 30.0–36.0)
MCV: 87.6 fl (ref 78.0–100.0)
Platelets: 208 10*3/uL (ref 150.0–400.0)
RBC: 5.21 Mil/uL — ABNORMAL HIGH (ref 3.87–5.11)
RDW: 13.2 % (ref 11.5–15.5)
WBC: 6.3 10*3/uL (ref 4.0–10.5)

## 2022-05-20 LAB — TSH: TSH: 2.15 u[IU]/mL (ref 0.35–5.50)

## 2022-05-20 MED ORDER — LISINOPRIL 5 MG PO TABS: 5.0000 mg | ORAL_TABLET | Freq: Every day | ORAL | 2 refills | Status: DC

## 2022-05-20 NOTE — Progress Notes (Signed)
Subjective:  Patient ID: Lindsay Valencia, female    DOB: [birth year]  Age: 46 y.o. MRN: [id number]  CC:  Chief Complaint  Patient presents with   Annual Exam    Pt states last 6 months has been hectic  Pt has noticed swelling in her legs    HPI Lindsay Valencia presents for Annual Exam and concern above, BP follow up.   Care team PCP, me Optho -Dr. Gershon Crane GYN, Dr. Ronita Hipps.  Mirena IUD. Yearly appt in December.  Psychiatry, Dr. Albertine Patricia, therapist weekly - Burnard Leigh.  Rheumatology - history of inflammatory arthropathy. Seen prior. Seen for knee issues prior. Better now. Follow up as needed.   Elevated blood pressure with family history of hypertension Slight elevation at her April visit, home monitoring with RTC precautions discussed.  Labs at that time were overall reassuring.  Slight elevated cholesterol but ASCVD risk score 1.2%. no FH of early CVD.  Home readings: 140-160/90 range.  No chest pain, headache, weakness.  No prior meds.  Some lower leg swelling past week or so. No dyspnea, no PND or orthopnea. Notices. Dent/swelling pushing on lower legs. No added salt to food.  No change in diet, takeout or restaurant food.   BP Readings from Last 3 Encounters:  05/20/22 (!) 138/98  12/23/21 128/80  11/19/21 132/76    Depression with anxiety Discussed at her establish care visit in April, treated with Pristiq 50 mg daily with alprazolam if needed, Klonopin at bedtime most nights.  Stressful year.  Still meets with therapist weekly.     05/20/2022    9:19 AM 11/19/2021    1:15 PM 09/03/2016   11:57 AM 11/25/2015    9:41 AM  Depression screen PHQ 2/9  Decreased Interest 0 1 0 0  Down, Depressed, Hopeless 0 0 0 0  PHQ - 2 Score 0 1 0 0  Altered sleeping 0 2    Tired, decreased energy 0 1    Change in appetite 0 0    Feeling bad or failure about yourself  0 0    Trouble concentrating 0 0    Moving slowly or fidgety/restless 0 0    Suicidal thoughts 0 0    PHQ-9 Score  0 4      Health Maintenance  Topic Date Due   TETANUS/TDAP  12/17/2021   INFLUENZA VACCINE  Never done   COVID-19 Vaccine (4 - Pfizer series) 06/05/2022 (Originally 09/14/2020)   PAP SMEAR-Modifier  08/01/2024   COLONOSCOPY (Pts 45-91yr Insurance coverage will need to be confirmed)  12/23/2024   Hepatitis C Screening  Completed   HIV Screening  Completed   HPV VACCINES  Aged Out  Appt with GYN in December.  Colonoscopy 5/23 - repeat 3 years as precancerous polyp.  Mammogram with GYN No derm currently - no new skin lesions.   Immunization History  Administered Date(s) Administered   PFIZER Comirnaty(Gray Top)Covid-19 Tri-Sucrose Vaccine 10/14/2019, 11/04/2019, 07/20/2020   Tdap 12/18/2011  Recommended new covid booster.  Tdap and flu vaccines today.   No results found. Dr .SGershon Crane recent visit few months ago.   Dental:every year.   Alcohol: none  Tobacco: none.   Exercise:  Minimal now. Active. Active at times.   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   Past Surgical History:  Procedure Laterality Date   CARPAL TUNNEL RELEASE Right 04/25/2015   Procedure: RIGHT CARPAL TUNNEL RELEASE;  Surgeon: Daryll Brod, MD;  Location: [place];  Service: Orthopedics;  Laterality: Right;   CARPAL TUNNEL RELEASE Left 05/23/2015   Procedure: LEFT CARPAL TUNNEL RELEASE;  Surgeon: Daryll Brod, MD;  Location: [place];  Service: Orthopedics;  Laterality: Left;    EXTRACORPOREAL SHOCK WAVE LITHOTRIPSY Right 12/05/2012   EXTRACORPOREAL SHOCK WAVE LITHOTRIPSY Left 09/29/2010   EYE SURGERY     FOR HORNER'S SYNDROME   Allergies  Allergen Reactions   Penicillins Rash   Prior to Admission medications   Medication Sig Start Date End Date Taking? Authorizing Provider  ALPRAZolam Duanne Moron) 1 MG tablet Take 1 tablet by mouth daily as needed. Patient not taking: Reported on 11/25/2021    [provider]  clonazePAM (KLONOPIN) 0.5 MG tablet Take 0.25 mg by mouth as needed for anxiety.    [provider]  desvenlafaxine (PRISTIQ) 50 MG 24 hr tablet Take 50 mg by mouth daily. 11/19/18   [provider]  levonorgestrel (MIRENA) 20 MCG/24HR IUD 1 each by Intrauterine route once.    [provider]  Multiple Vitamins-Minerals (MULTIVITAMIN GUMMIES ADULT PO) Take by mouth. VITA FUSION TAKE 2 GUMMIES DAILY    [provider]  OVER THE COUNTER MEDICATION daily. TAKE 500 MG-TAKE 2 GUMMIES DAILY-TUMERIC    [provider]   Social History   Socioeconomic History   Marital status: Married    Spouse name: Not on file   Number of children: Not on file   Years of education: Not on file   Highest education level: Not on file  Occupational History   Not on file  Tobacco Use   Smoking status: Never   Smokeless tobacco: Never  Vaping Use   Vaping Use: Never used  Substance and Sexual Activity   Alcohol use: No   Drug use: Not Currently   Sexual activity: Yes  Other Topics Concern   Not on file  Social History Narrative   Not on file   Social Determinants of Health   Financial Resource Strain: Not on file  Food Insecurity: Not on file  Transportation Needs: Not on file  Physical Activity: Not on file  Stress: Not on file  Social Connections: Not on file  Intimate Partner Violence: Not on file    Review of Systems 13 point review of systems per patient health survey noted.  Negative other than as  indicated above or in HPI.    Objective:   Vitals:   05/20/22 0922  BP: (!) 138/98  Pulse: 71  Temp: 98.2 F (36.8 C)  SpO2: 99%  Weight: 180 lb (81.6 kg)  Height: 5' 4"  (1.626 m)     Physical Exam Vitals reviewed.  Constitutional:      Appearance: She is well-developed.  HENT:     Head: Normocephalic and atraumatic.     Right Ear: External ear normal.     Left Ear: External ear normal.  Eyes:     Conjunctiva/sclera: Conjunctivae normal.  Pupils: Pupils are equal, round, and reactive to light.  Neck:     Thyroid: No thyromegaly.  Cardiovascular:     Rate and Rhythm: Normal rate and regular rhythm.     Heart sounds: Normal heart sounds. No murmur heard. Pulmonary:     Effort: Pulmonary effort is normal. No respiratory distress.     Breath sounds: Normal breath sounds. No wheezing.  Abdominal:     General: Bowel sounds are normal.     Palpations: Abdomen is soft.     Tenderness: There is no abdominal tenderness.  Musculoskeletal:        General: No tenderness. Normal range of motion.     Cervical back: Normal range of motion and neck supple.     Right lower leg: Edema present.     Left lower leg: Edema (trace nonpitting at lower 1/3 legs.) present.  Lymphadenopathy:     Cervical: No cervical adenopathy.  Skin:    General: Skin is warm and dry.     Findings: No rash.  Neurological:     Mental Status: She is alert and oriented to person, place, and time.  Psychiatric:        Behavior: Behavior normal.        Thought Content: Thought content normal.     Assessment & Plan:  Lindsay Valencia is a 46 y.o. female . Annual physical exam  - -anticipatory guidance as below in AVS, screening labs above. Health maintenance items as above in HPI discussed/recommended as applicable.   Need for tetanus booster - Plan: Tdap vaccine greater than or equal to 7yo IM  Need for influenza vaccination - Plan: Flu Vaccine QUAD 6+ mos PF IM (Fluarix Quad PF)  Pedal edema -  Plan: Comprehensive metabolic panel, Lipid panel, TSH, CBC  -New concern, minimal on exam.  Check labs but previous labs reassuring.  Handout on salty 6 for sodium avoidance of foods but no added salt.  Treatment of blood pressure as below.  Essential hypertension - Plan: Comprehensive metabolic panel, TSH, lisinopril (ZESTRIL) 5 MG tablet  -Persistent elevated readings at home and based on in office reading will start antihypertensive.  Avoiding amlodipine initially given pedal edema concern, start low-dose lisinopril, recheck 1 month.  Likely will need higher dosing.  Home monitoring with RTC precautions given.  Meds ordered this encounter  Medications   lisinopril (ZESTRIL) 5 MG tablet    Sig: Take 1 tablet (5 mg total) by mouth daily.    Dispense:  30 tablet    Refill:  2   Patient Instructions  See the handout on foods that have higher sodium.  Start lisinopril for blood pressure. Low dose initially and recheck in 1 month.  Keep a record of your blood pressures outside of the office and bring them to the next office visit. See info on leg swelling.  Low intensity exercise with goal of 150 minutes per week.    Preventive Care 55-63 Years Old, Female Preventive care refers to lifestyle choices and visits with your health care provider that can promote health and wellness. Preventive care visits are also called wellness exams. What can I expect for my preventive care visit? Counseling Your health care provider may ask you questions about your: Medical history, including: Past medical problems. Family medical history. Pregnancy history. Current health, including: Menstrual cycle. Method of birth control. Emotional well-being. Home life and relationship well-being. Sexual activity and sexual health. Lifestyle, including: Alcohol, nicotine or tobacco, and drug use. Access  to firearms. Diet, exercise, and sleep habits. Work and work Statistician. Sunscreen use. Safety issues  such as seatbelt and bike helmet use. Physical exam Your health care provider will check your: Height and weight. These may be used to calculate your BMI (body mass index). BMI is a measurement that tells if you are at a healthy weight. Waist circumference. This measures the distance around your waistline. This measurement also tells if you are at a healthy weight and may help predict your risk of certain diseases, such as type 2 diabetes and high blood pressure. Heart rate and blood pressure. Body temperature. Skin for abnormal spots. What immunizations do I need?  Vaccines are usually given at various ages, according to a schedule. Your health care provider will recommend vaccines for you based on your age, medical history, and lifestyle or other factors, such as travel or where you work. What tests do I need? Screening Your health care provider may recommend screening tests for certain conditions. This may include: Lipid and cholesterol levels. Diabetes screening. This is done by checking your blood sugar (glucose) after you have not eaten for a while (fasting). Pelvic exam and Pap test. Hepatitis B test. Hepatitis C test. HIV (human immunodeficiency virus) test. STI (sexually transmitted infection) testing, if you are at risk. Lung cancer screening. Colorectal cancer screening. Mammogram. Talk with your health care provider about when you should start having regular mammograms. This may depend on whether you have a family history of breast cancer. BRCA-related cancer screening. This may be done if you have a family history of breast, ovarian, tubal, or peritoneal cancers. Bone density scan. This is done to screen for osteoporosis. Talk with your health care provider about your test results, treatment options, and if necessary, the need for more tests. Follow these instructions at home: Eating and drinking  Eat a diet that includes fresh fruits and vegetables, whole grains, lean  protein, and low-fat dairy products. Take vitamin and mineral supplements as recommended by your health care provider. Do not drink alcohol if: Your health care provider tells you not to drink. You are pregnant, may be pregnant, or are planning to become pregnant. If you drink alcohol: Limit how much you have to 0-1 drink a day. Know how much alcohol is in your drink. In the [place] one drink equals one 12 oz bottle of beer (355 mL), one 5 oz glass of wine (148 mL), or one 1 oz glass of hard liquor (44 mL). Lifestyle Brush your teeth every morning and night with fluoride toothpaste. Floss one time each day. Exercise for at least 30 minutes 5 or more days each week. Do not use any products that contain nicotine or tobacco. These products include cigarettes, chewing tobacco, and vaping devices, such as e-cigarettes. If you need help quitting, ask your health care provider. Do not use drugs. If you are sexually active, practice safe sex. Use a condom or other form of protection to prevent STIs. If you do not wish to become pregnant, use a form of birth control. If you plan to become pregnant, see your health care provider for a prepregnancy visit. Take aspirin only as told by your health care provider. Make sure that you understand how much to take and what form to take. Work with your health care provider to find out whether it is safe and beneficial for you to take aspirin daily. Find healthy ways to manage stress, such as: Meditation, yoga, or listening to music. Journaling. Talking to a  trusted person. Spending time with friends and family. Minimize exposure to UV radiation to reduce your risk of skin cancer. Safety Always wear your seat belt while driving or riding in a vehicle. Do not drive: If you have been drinking alcohol. Do not ride with someone who has been drinking. When you are tired or distracted. While texting. If you have been using any mind-altering substances or  drugs. Wear a helmet and other protective equipment during sports activities. If you have firearms in your house, make sure you follow all gun safety procedures. Seek help if you have been physically or sexually abused. What's next? Visit your health care provider once a year for an annual wellness visit. Ask your health care provider how often you should have your eyes and teeth checked. Stay up to date on all vaccines. This information is not intended to replace advice given to you by your health care provider. Make sure you discuss any questions you have with your health care provider. Document Revised: 01/15/2021 Document Reviewed: 01/15/2021 Elsevier Patient Education  [place].  Peripheral Edema  Peripheral edema is swelling that is caused by a buildup of fluid. Peripheral edema most often affects the lower legs, ankles, and feet. It can also develop in the arms, hands, and face. The area of the body that has peripheral edema will look swollen. It may also feel heavy or warm. Your clothes may start to feel tight. Pressing on the area may make a temporary dent in your skin (pitting edema). You may not be able to move your swollen arm or leg as much as usual. There are many causes of peripheral edema. It can happen because of a complication of other conditions such as heart failure, kidney disease, or a problem with your circulation. It also can be a side effect of certain medicines or happen because of an infection. It often happens to women during pregnancy. Sometimes, the cause is not known. Follow these instructions at home: Managing pain, stiffness, and swelling  Raise (elevate) your legs while you are sitting or lying down. Move around often to prevent stiffness and to reduce swelling. Do not sit or stand for long periods of time. Do not wear tight clothing. Do not wear garters on your upper legs. Exercise your legs to get your circulation going. This helps to move the  fluid back into your blood vessels, and it may help the swelling go down. Wear compression stockings as told by your health care provider. These stockings help to prevent blood clots and reduce swelling in your legs. It is important that these are the correct size. These stockings should be prescribed by your doctor to prevent possible injuries. If elastic bandages or wraps are recommended, use them as told by your health care provider. Medicines Take over-the-counter and prescription medicines only as told by your health care provider. Your health care provider may prescribe medicine to help your body get rid of excess water (diuretic). Take this medicine if you are told to take it. General instructions Eat a low-salt (low-sodium) diet as told by your health care provider. Sometimes, eating less salt may reduce swelling. Pay attention to any changes in your symptoms. Moisturize your skin daily to help prevent skin from cracking and draining. Keep all follow-up visits. This is important. Contact a health care provider if: You have a fever. You have swelling in only one leg. You have increased swelling, redness, or pain in one or both of your legs. You have  drainage or sores at the area where you have edema. Get help right away if: You have edema that starts suddenly or is getting worse, especially if you are pregnant or have a medical condition. You develop shortness of breath, especially when you are lying down. You have pain in your chest or abdomen. You feel weak. You feel like you will faint. These symptoms may be an emergency. Get help right away. Call 911. Do not wait to see if the symptoms will go away. Do not drive yourself to the hospital. Summary Peripheral edema is swelling that is caused by a buildup of fluid. Peripheral edema most often affects the lower legs, ankles, and feet. Move around often to prevent stiffness and to reduce swelling. Do not sit or stand for long periods  of time. Pay attention to any changes in your symptoms. Contact a health care provider if you have edema that starts suddenly or is getting worse, especially if you are pregnant or have a medical condition. Get help right away if you develop shortness of breath, especially when lying down. This information is not intended to replace advice given to you by your health care provider. Make sure you discuss any questions you have with your health care provider. Document Revised: 03/24/2021 Document Reviewed: 03/24/2021 Elsevier Patient Education  [place] Your Hypertension Hypertension, also called high blood pressure, is when the force of the blood pressing against the walls of the arteries is too strong. Arteries are blood vessels that carry blood from your heart throughout your body. Hypertension forces the heart to work harder to pump blood and may cause the arteries to become narrow or stiff. Understanding blood pressure readings A blood pressure reading includes a higher number over a lower number: The first, or top, number is called the systolic pressure. It is a measure of the pressure in your arteries as your heart beats. The second, or bottom number, is called the diastolic pressure. It is a measure of the pressure in your arteries as the heart relaxes. For most people, a normal blood pressure is below 120/80. Your personal target blood pressure may vary depending on your medical conditions, your age, and other factors. Blood pressure is classified into four stages. Based on your blood pressure reading, your health care provider may use the following stages to determine what type of treatment you need, if any. Systolic pressure and diastolic pressure are measured in a unit called millimeters of mercury (mmHg). Normal Systolic pressure: below 858. Diastolic pressure: below 80. Elevated Systolic pressure: 850-277. Diastolic pressure: below 80. Hypertension stage  1 Systolic pressure: 412-878. Diastolic pressure: 67-67. Hypertension stage 2 Systolic pressure: 209 or above. Diastolic pressure: 90 or above. How can this condition affect me? Managing your hypertension is very important. Over time, hypertension can damage the arteries and decrease blood flow to parts of the body, including the brain, heart, and kidneys. Having untreated or uncontrolled hypertension can lead to: A heart attack. A stroke. A weakened blood vessel (aneurysm). Heart failure. Kidney damage. Eye damage. Memory and concentration problems. Vascular dementia. What actions can I take to manage this condition? Hypertension can be managed by making lifestyle changes and possibly by taking medicines. Your health care provider will help you make a plan to bring your blood pressure within a normal range. You may be referred for counseling on a healthy diet and physical activity. Nutrition  Eat a diet that is high in fiber and potassium, and low in  salt (sodium), added sugar, and fat. An example eating plan is called the DASH diet. DASH stands for Dietary Approaches to Stop Hypertension. To eat this way: Eat plenty of fresh fruits and vegetables. Try to fill one-half of your plate at each meal with fruits and vegetables. Eat whole grains, such as whole-wheat pasta, brown rice, or whole-grain bread. Fill about one-fourth of your plate with whole grains. Eat low-fat dairy products. Avoid fatty cuts of meat, processed or cured meats, and poultry with skin. Fill about one-fourth of your plate with lean proteins such as fish, chicken without skin, beans, eggs, and tofu. Avoid pre-made and processed foods. These tend to be higher in sodium, added sugar, and fat. Reduce your daily sodium intake. Many people with hypertension should eat less than 1,500 mg of sodium a day. Lifestyle  Work with your health care provider to maintain a healthy body weight or to lose weight. Ask what an ideal  weight is for you. Get at least 30 minutes of exercise that causes your heart to beat faster (aerobic exercise) most days of the week. Activities may include walking, swimming, or biking. Include exercise to strengthen your muscles (resistance exercise), such as weight lifting, as part of your weekly exercise routine. Try to do these types of exercises for 30 minutes at least 3 days a week. Do not use any products that contain nicotine or tobacco. These products include cigarettes, chewing tobacco, and vaping devices, such as e-cigarettes. If you need help quitting, ask your health care provider. Control any long-term (chronic) conditions you have, such as high cholesterol or diabetes. Identify your sources of stress and find ways to manage stress. This may include meditation, deep breathing, or making time for fun activities. Alcohol use Do not drink alcohol if: Your health care provider tells you not to drink. You are pregnant, may be pregnant, or are planning to become pregnant. If you drink alcohol: Limit how much you have to: 0-1 drink a day for women. 0-2 drinks a day for men. Know how much alcohol is in your drink. In the [place] one drink equals one 12 oz bottle of beer (355 mL), one 5 oz glass of wine (148 mL), or one 1 oz glass of hard liquor (44 mL). Medicines Your health care provider may prescribe medicine if lifestyle changes are not enough to get your blood pressure under control and if: Your systolic blood pressure is 130 or higher. Your diastolic blood pressure is 80 or higher. Take medicines only as told by your health care provider. Follow the directions carefully. Blood pressure medicines must be taken as told by your health care provider. The medicine does not work as well when you skip doses. Skipping doses also puts you at risk for problems. Monitoring Before you monitor your blood pressure: Do not smoke, drink caffeinated beverages, or exercise within 30 minutes before  taking a measurement. Use the bathroom and empty your bladder (urinate). Sit quietly for at least 5 minutes before taking measurements. Monitor your blood pressure at home as told by your health care provider. To do this: Sit with your back straight and supported. Place your feet flat on the floor. Do not cross your legs. Support your arm on a flat surface, such as a table. Make sure your upper arm is at heart level. Each time you measure, take two or three readings one minute apart and record the results. You may also need to have your blood pressure checked regularly by your health  care provider. General information Talk with your health care provider about your diet, exercise habits, and other lifestyle factors that may be contributing to hypertension. Review all the medicines you take with your health care provider because there may be side effects or interactions. Keep all follow-up visits. Your health care provider can help you create and adjust your plan for managing your high blood pressure. Where to find more information National Heart, Lung, and Blood Institute: https://wilson-eaton.com/ American Heart Association: www.heart.org Contact a health care provider if: You think you are having a reaction to medicines you have taken. You have repeated (recurrent) headaches. You feel dizzy. You have swelling in your ankles. You have trouble with your vision. Get help right away if: You develop a severe headache or confusion. You have unusual weakness or numbness, or you feel faint. You have severe pain in your chest or abdomen. You vomit repeatedly. You have trouble breathing. These symptoms may be an emergency. Get help right away. Call 911. Do not wait to see if the symptoms will go away. Do not drive yourself to the hospital. Summary Hypertension is when the force of blood pumping through your arteries is too strong. If this condition is not controlled, it may put you at risk for serious  complications. Your personal target blood pressure may vary depending on your medical conditions, your age, and other factors. For most people, a normal blood pressure is less than 120/80. Hypertension is managed by lifestyle changes, medicines, or both. Lifestyle changes to help manage hypertension include losing weight, eating a healthy, low-sodium diet, exercising more, stopping smoking, and limiting alcohol. This information is not intended to replace advice given to you by your health care provider. Make sure you discuss any questions you have with your health care provider. Document Revised: 04/03/2021 Document Reviewed: 04/03/2021 Elsevier Patient Education  [place],   Merri Ray, MD [place], [facility] 05/20/22 10:14 AM

## 2022-05-20 NOTE — Patient Instructions (Addendum)
See the handout on foods that have higher sodium.  Start lisinopril for blood pressure. Low dose initially and recheck in 1 month.  Keep a record of your blood pressures outside of the office and bring them to the next office visit. See info on leg swelling.  Low intensity exercise with goal of 150 minutes per week.    Preventive Care 48-48 Years Old, Female Preventive care refers to lifestyle choices and visits with your health care provider that can promote health and wellness. Preventive care visits are also called wellness exams. What can I expect for my preventive care visit? Counseling Your health care provider may ask you questions about your: Medical history, including: Past medical problems. Family medical history. Pregnancy history. Current health, including: Menstrual cycle. Method of birth control. Emotional well-being. Home life and relationship well-being. Sexual activity and sexual health. Lifestyle, including: Alcohol, nicotine or tobacco, and drug use. Access to firearms. Diet, exercise, and sleep habits. Work and work Statistician. Sunscreen use. Safety issues such as seatbelt and bike helmet use. Physical exam Your health care provider will check your: Height and weight. These may be used to calculate your BMI (body mass index). BMI is a measurement that tells if you are at a healthy weight. Waist circumference. This measures the distance around your waistline. This measurement also tells if you are at a healthy weight and may help predict your risk of certain diseases, such as type 2 diabetes and high blood pressure. Heart rate and blood pressure. Body temperature. Skin for abnormal spots. What immunizations do I need?  Vaccines are usually given at various ages, according to a schedule. Your health care provider will recommend vaccines for you based on your age, medical history, and lifestyle or other factors, such as travel or where you work. What tests do  I need? Screening Your health care provider may recommend screening tests for certain conditions. This may include: Lipid and cholesterol levels. Diabetes screening. This is done by checking your blood sugar (glucose) after you have not eaten for a while (fasting). Pelvic exam and Pap test. Hepatitis B test. Hepatitis C test. HIV (human immunodeficiency virus) test. STI (sexually transmitted infection) testing, if you are at risk. Lung cancer screening. Colorectal cancer screening. Mammogram. Talk with your health care provider about when you should start having regular mammograms. This may depend on whether you have a family history of breast cancer. BRCA-related cancer screening. This may be done if you have a family history of breast, ovarian, tubal, or peritoneal cancers. Bone density scan. This is done to screen for osteoporosis. Talk with your health care provider about your test results, treatment options, and if necessary, the need for more tests. Follow these instructions at home: Eating and drinking  Eat a diet that includes fresh fruits and vegetables, whole grains, lean protein, and low-fat dairy products. Take vitamin and mineral supplements as recommended by your health care provider. Do not drink alcohol if: Your health care provider tells you not to drink. You are pregnant, may be pregnant, or are planning to become pregnant. If you drink alcohol: Limit how much you have to 0-1 drink a day. Know how much alcohol is in your drink. In the [place] one drink equals one 12 oz bottle of beer (355 mL), one 5 oz glass of wine (148 mL), or one 1 oz glass of hard liquor (44 mL). Lifestyle Brush your teeth every morning and night with fluoride toothpaste. Floss one time each day. Exercise for at  least 30 minutes 5 or more days each week. Do not use any products that contain nicotine or tobacco. These products include cigarettes, chewing tobacco, and vaping devices, such as  e-cigarettes. If you need help quitting, ask your health care provider. Do not use drugs. If you are sexually active, practice safe sex. Use a condom or other form of protection to prevent STIs. If you do not wish to become pregnant, use a form of birth control. If you plan to become pregnant, see your health care provider for a prepregnancy visit. Take aspirin only as told by your health care provider. Make sure that you understand how much to take and what form to take. Work with your health care provider to find out whether it is safe and beneficial for you to take aspirin daily. Find healthy ways to manage stress, such as: Meditation, yoga, or listening to music. Journaling. Talking to a trusted person. Spending time with friends and family. Minimize exposure to UV radiation to reduce your risk of skin cancer. Safety Always wear your seat belt while driving or riding in a vehicle. Do not drive: If you have been drinking alcohol. Do not ride with someone who has been drinking. When you are tired or distracted. While texting. If you have been using any mind-altering substances or drugs. Wear a helmet and other protective equipment during sports activities. If you have firearms in your house, make sure you follow all gun safety procedures. Seek help if you have been physically or sexually abused. What's next? Visit your health care provider once a year for an annual wellness visit. Ask your health care provider how often you should have your eyes and teeth checked. Stay up to date on all vaccines. This information is not intended to replace advice given to you by your health care provider. Make sure you discuss any questions you have with your health care provider. Document Revised: 01/15/2021 Document Reviewed: 01/15/2021 Elsevier Patient Education  [place].  Peripheral Edema  Peripheral edema is swelling that is caused by a buildup of fluid. Peripheral edema most often  affects the lower legs, ankles, and feet. It can also develop in the arms, hands, and face. The area of the body that has peripheral edema will look swollen. It may also feel heavy or warm. Your clothes may start to feel tight. Pressing on the area may make a temporary dent in your skin (pitting edema). You may not be able to move your swollen arm or leg as much as usual. There are many causes of peripheral edema. It can happen because of a complication of other conditions such as heart failure, kidney disease, or a problem with your circulation. It also can be a side effect of certain medicines or happen because of an infection. It often happens to women during pregnancy. Sometimes, the cause is not known. Follow these instructions at home: Managing pain, stiffness, and swelling  Raise (elevate) your legs while you are sitting or lying down. Move around often to prevent stiffness and to reduce swelling. Do not sit or stand for long periods of time. Do not wear tight clothing. Do not wear garters on your upper legs. Exercise your legs to get your circulation going. This helps to move the fluid back into your blood vessels, and it may help the swelling go down. Wear compression stockings as told by your health care provider. These stockings help to prevent blood clots and reduce swelling in your legs. It is important  that these are the correct size. These stockings should be prescribed by your doctor to prevent possible injuries. If elastic bandages or wraps are recommended, use them as told by your health care provider. Medicines Take over-the-counter and prescription medicines only as told by your health care provider. Your health care provider may prescribe medicine to help your body get rid of excess water (diuretic). Take this medicine if you are told to take it. General instructions Eat a low-salt (low-sodium) diet as told by your health care provider. Sometimes, eating less salt may reduce  swelling. Pay attention to any changes in your symptoms. Moisturize your skin daily to help prevent skin from cracking and draining. Keep all follow-up visits. This is important. Contact a health care provider if: You have a fever. You have swelling in only one leg. You have increased swelling, redness, or pain in one or both of your legs. You have drainage or sores at the area where you have edema. Get help right away if: You have edema that starts suddenly or is getting worse, especially if you are pregnant or have a medical condition. You develop shortness of breath, especially when you are lying down. You have pain in your chest or abdomen. You feel weak. You feel like you will faint. These symptoms may be an emergency. Get help right away. Call 911. Do not wait to see if the symptoms will go away. Do not drive yourself to the hospital. Summary Peripheral edema is swelling that is caused by a buildup of fluid. Peripheral edema most often affects the lower legs, ankles, and feet. Move around often to prevent stiffness and to reduce swelling. Do not sit or stand for long periods of time. Pay attention to any changes in your symptoms. Contact a health care provider if you have edema that starts suddenly or is getting worse, especially if you are pregnant or have a medical condition. Get help right away if you develop shortness of breath, especially when lying down. This information is not intended to replace advice given to you by your health care provider. Make sure you discuss any questions you have with your health care provider. Document Revised: 03/24/2021 Document Reviewed: 03/24/2021 Elsevier Patient Education  [place] Your Hypertension Hypertension, also called high blood pressure, is when the force of the blood pressing against the walls of the arteries is too strong. Arteries are blood vessels that carry blood from your heart throughout your body.  Hypertension forces the heart to work harder to pump blood and may cause the arteries to become narrow or stiff. Understanding blood pressure readings A blood pressure reading includes a higher number over a lower number: The first, or top, number is called the systolic pressure. It is a measure of the pressure in your arteries as your heart beats. The second, or bottom number, is called the diastolic pressure. It is a measure of the pressure in your arteries as the heart relaxes. For most people, a normal blood pressure is below 120/80. Your personal target blood pressure may vary depending on your medical conditions, your age, and other factors. Blood pressure is classified into four stages. Based on your blood pressure reading, your health care provider may use the following stages to determine what type of treatment you need, if any. Systolic pressure and diastolic pressure are measured in a unit called millimeters of mercury (mmHg). Normal Systolic pressure: below 992. Diastolic pressure: below 80. Elevated Systolic pressure: 426-834. Diastolic pressure: below  80. Hypertension stage 1 Systolic pressure: 027-253. Diastolic pressure: 66-44. Hypertension stage 2 Systolic pressure: 034 or above. Diastolic pressure: 90 or above. How can this condition affect me? Managing your hypertension is very important. Over time, hypertension can damage the arteries and decrease blood flow to parts of the body, including the brain, heart, and kidneys. Having untreated or uncontrolled hypertension can lead to: A heart attack. A stroke. A weakened blood vessel (aneurysm). Heart failure. Kidney damage. Eye damage. Memory and concentration problems. Vascular dementia. What actions can I take to manage this condition? Hypertension can be managed by making lifestyle changes and possibly by taking medicines. Your health care provider will help you make a plan to bring your blood pressure within a normal  range. You may be referred for counseling on a healthy diet and physical activity. Nutrition  Eat a diet that is high in fiber and potassium, and low in salt (sodium), added sugar, and fat. An example eating plan is called the DASH diet. DASH stands for Dietary Approaches to Stop Hypertension. To eat this way: Eat plenty of fresh fruits and vegetables. Try to fill one-half of your plate at each meal with fruits and vegetables. Eat whole grains, such as whole-wheat pasta, brown rice, or whole-grain bread. Fill about one-fourth of your plate with whole grains. Eat low-fat dairy products. Avoid fatty cuts of meat, processed or cured meats, and poultry with skin. Fill about one-fourth of your plate with lean proteins such as fish, chicken without skin, beans, eggs, and tofu. Avoid pre-made and processed foods. These tend to be higher in sodium, added sugar, and fat. Reduce your daily sodium intake. Many people with hypertension should eat less than 1,500 mg of sodium a day. Lifestyle  Work with your health care provider to maintain a healthy body weight or to lose weight. Ask what an ideal weight is for you. Get at least 30 minutes of exercise that causes your heart to beat faster (aerobic exercise) most days of the week. Activities may include walking, swimming, or biking. Include exercise to strengthen your muscles (resistance exercise), such as weight lifting, as part of your weekly exercise routine. Try to do these types of exercises for 30 minutes at least 3 days a week. Do not use any products that contain nicotine or tobacco. These products include cigarettes, chewing tobacco, and vaping devices, such as e-cigarettes. If you need help quitting, ask your health care provider. Control any long-term (chronic) conditions you have, such as high cholesterol or diabetes. Identify your sources of stress and find ways to manage stress. This may include meditation, deep breathing, or making time for fun  activities. Alcohol use Do not drink alcohol if: Your health care provider tells you not to drink. You are pregnant, may be pregnant, or are planning to become pregnant. If you drink alcohol: Limit how much you have to: 0-1 drink a day for women. 0-2 drinks a day for men. Know how much alcohol is in your drink. In the [place] one drink equals one 12 oz bottle of beer (355 mL), one 5 oz glass of wine (148 mL), or one 1 oz glass of hard liquor (44 mL). Medicines Your health care provider may prescribe medicine if lifestyle changes are not enough to get your blood pressure under control and if: Your systolic blood pressure is 130 or higher. Your diastolic blood pressure is 80 or higher. Take medicines only as told by your health care provider. Follow the directions carefully. Blood pressure  medicines must be taken as told by your health care provider. The medicine does not work as well when you skip doses. Skipping doses also puts you at risk for problems. Monitoring Before you monitor your blood pressure: Do not smoke, drink caffeinated beverages, or exercise within 30 minutes before taking a measurement. Use the bathroom and empty your bladder (urinate). Sit quietly for at least 5 minutes before taking measurements. Monitor your blood pressure at home as told by your health care provider. To do this: Sit with your back straight and supported. Place your feet flat on the floor. Do not cross your legs. Support your arm on a flat surface, such as a table. Make sure your upper arm is at heart level. Each time you measure, take two or three readings one minute apart and record the results. You may also need to have your blood pressure checked regularly by your health care provider. General information Talk with your health care provider about your diet, exercise habits, and other lifestyle factors that may be contributing to hypertension. Review all the medicines you take with your health care  provider because there may be side effects or interactions. Keep all follow-up visits. Your health care provider can help you create and adjust your plan for managing your high blood pressure. Where to find more information National Heart, Lung, and Blood Institute: https://wilson-eaton.com/ American Heart Association: www.heart.org Contact a health care provider if: You think you are having a reaction to medicines you have taken. You have repeated (recurrent) headaches. You feel dizzy. You have swelling in your ankles. You have trouble with your vision. Get help right away if: You develop a severe headache or confusion. You have unusual weakness or numbness, or you feel faint. You have severe pain in your chest or abdomen. You vomit repeatedly. You have trouble breathing. These symptoms may be an emergency. Get help right away. Call 911. Do not wait to see if the symptoms will go away. Do not drive yourself to the hospital. Summary Hypertension is when the force of blood pumping through your arteries is too strong. If this condition is not controlled, it may put you at risk for serious complications. Your personal target blood pressure may vary depending on your medical conditions, your age, and other factors. For most people, a normal blood pressure is less than 120/80. Hypertension is managed by lifestyle changes, medicines, or both. Lifestyle changes to help manage hypertension include losing weight, eating a healthy, low-sodium diet, exercising more, stopping smoking, and limiting alcohol. This information is not intended to replace advice given to you by your health care provider. Make sure you discuss any questions you have with your health care provider. Document Revised: 04/03/2021 Document Reviewed: 04/03/2021 Elsevier Patient Education  [place].

## 2022-05-25 DIAGNOSIS — F321 Major depressive disorder, single episode, moderate: Secondary | ICD-10-CM | POA: Diagnosis not present

## 2022-05-28 ENCOUNTER — Other Ambulatory Visit: Payer: Self-pay | Admitting: Family Medicine

## 2022-05-28 DIAGNOSIS — D582 Other hemoglobinopathies: Secondary | ICD-10-CM

## 2022-05-28 NOTE — Progress Notes (Signed)
See lab notes

## 2022-06-01 DIAGNOSIS — F321 Major depressive disorder, single episode, moderate: Secondary | ICD-10-CM | POA: Diagnosis not present

## 2022-06-08 DIAGNOSIS — F321 Major depressive disorder, single episode, moderate: Secondary | ICD-10-CM | POA: Diagnosis not present

## 2022-06-15 DIAGNOSIS — F321 Major depressive disorder, single episode, moderate: Secondary | ICD-10-CM | POA: Diagnosis not present

## 2022-06-24 ENCOUNTER — Ambulatory Visit (INDEPENDENT_AMBULATORY_CARE_PROVIDER_SITE_OTHER): Payer: BC Managed Care – PPO | Admitting: Family Medicine

## 2022-06-24 ENCOUNTER — Encounter: Payer: Self-pay | Admitting: Family Medicine

## 2022-06-24 VITALS — BP 128/80 | HR 78 | Temp 98.0°F | Ht 64.0 in | Wt 180.8 lb

## 2022-06-24 DIAGNOSIS — D582 Other hemoglobinopathies: Secondary | ICD-10-CM

## 2022-06-24 DIAGNOSIS — F419 Anxiety disorder, unspecified: Secondary | ICD-10-CM

## 2022-06-24 DIAGNOSIS — I10 Essential (primary) hypertension: Secondary | ICD-10-CM

## 2022-06-24 DIAGNOSIS — R202 Paresthesia of skin: Secondary | ICD-10-CM

## 2022-06-24 LAB — CBC
HCT: 43.3 % (ref 36.0–46.0)
Hemoglobin: 14.8 g/dL (ref 12.0–15.0)
MCHC: 34.1 g/dL (ref 30.0–36.0)
MCV: 88.2 fl (ref 78.0–100.0)
Platelets: 232 10*3/uL (ref 150.0–400.0)
RBC: 4.91 Mil/uL (ref 3.87–5.11)
RDW: 13.6 % (ref 11.5–15.5)
WBC: 6.3 10*3/uL (ref 4.0–10.5)

## 2022-06-24 LAB — BASIC METABOLIC PANEL
BUN: 16 mg/dL (ref 6–23)
CO2: 30 mEq/L (ref 19–32)
Calcium: 8.9 mg/dL (ref 8.4–10.5)
Chloride: 103 mEq/L (ref 96–112)
Creatinine, Ser: 0.72 mg/dL (ref 0.40–1.20)
GFR: 98.77 mL/min (ref >60.00)
Glucose, Bld: 92 mg/dL (ref 70–99)
Potassium: 3.6 mEq/L (ref 3.5–5.1)
Sodium: 138 mEq/L (ref 135–145)

## 2022-06-24 MED ORDER — LISINOPRIL 5 MG PO TABS: 5.0000 mg | ORAL_TABLET | Freq: Every day | ORAL | 1 refills | Status: DC

## 2022-06-24 NOTE — Patient Instructions (Addendum)
I agree with the email to Dr. Albertine Patricia - hopefully she will some insight into anxiety symptoms. I do not think those are due to lisinopril and blood pressure looks better. No changes for now.  Please follow up if hand/foot symptoms persist or worsen. We can refer you to neuro or back to hand specialist if needed. Take care.

## 2022-06-24 NOTE — Progress Notes (Unsigned)
Subjective:  Patient ID: Lindsay Valencia, female    DOB: 05-Jul-1976  Age: 46 y.o. MRN: [id number]  CC:  Chief Complaint  Patient presents with   Hypertension    Pt wants to know if the meds increases anxiety     HPI Lindsay Valencia presents for   Hypertension: Elevated blood pressures on home readings, earlier this year.  Family history of hypertension.  Blood pressure has been in the 140-160/90 range.  Minimal pedal edema discussed at her October 18 visit.  Based on persistent elevated readings diagnosed with hypertension, started on lisinopril 5 mg daily.  CMP, TSH reassuring.  Hemoglobin was slightly elevated on her CBC, 15.5 on October 18. No new side effects with lisinopril. Some anxiety. Still meeting with therapist and followed by Dr. Albertine Patricia - has sent her email about recent flair, not needing xanax, has been using klonopin - sometimes during the day. No new stressors.  Ankle swelling resolved. Eating less salt, minimal prior.  Home readings: 123-135/69-91 No new cough.  BP Readings from Last 3 Encounters:  06/24/22 128/80  05/20/22 (!) 144/92  12/23/21 128/80   Lab Results  Component Value Date   CREATININE 0.85 05/20/2022   Numbness:  Hx of carpal tunnel surgery both sides, years ago. - some left greater than R hand numbness, notes at night. Past month at times. Sometimes in toes. Episodic, not daily. Minimal sx's. Not to degree to see hand specialist. No neck pain.       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   Dental crowns present    Depression    History of kidney stones     Horner's syndrome    PP SVD 12/17/11 F 12/18/2011   Past Surgical History:  Procedure Laterality Date   CARPAL TUNNEL RELEASE Right 04/25/2015   Procedure: RIGHT CARPAL TUNNEL RELEASE;  Surgeon: Daryll Brod, MD;  Location: [facility];  Service: Orthopedics;  Laterality: Right;   CARPAL TUNNEL RELEASE Left 05/23/2015   Procedure: LEFT CARPAL TUNNEL RELEASE;  Surgeon: Daryll Brod, MD;  Location: [place] [facility];  Service: Orthopedics;  Laterality: Left;   EXTRACORPOREAL SHOCK WAVE LITHOTRIPSY Right 12/05/2012   EXTRACORPOREAL SHOCK WAVE LITHOTRIPSY Left 09/29/2010   EYE SURGERY     FOR HORNER'S SYNDROME   Allergies  Allergen Reactions   Penicillin G Other (See Comments)   Penicillins Rash   Prior to Admission medications   Medication Sig Start Date End Date Taking? Authorizing Provider  clonazePAM (KLONOPIN) 0.5 MG tablet Take 0.25 mg by mouth as needed for anxiety.   Yes [provider]  desvenlafaxine (PRISTIQ) 50 MG 24 hr tablet Take 50 mg by mouth daily. 11/19/18  Yes [provider]  dexamethasone (DECADRON) 120 MG/30ML SOLN injection Take 10 mg by injection route. 10/30/21  Yes [provider]  levonorgestrel (MIRENA) 20 MCG/24HR IUD 1 each by Intrauterine route once.   Yes [provider]  lisinopril (ZESTRIL) 5 MG tablet Take 1 tablet (5 mg total) by mouth daily. 05/20/22  Yes Wendie Agreste, MD  Multiple Vitamins-Minerals (MULTIVITAMIN  GUMMIES ADULT PO) Take by mouth. VITA FUSION TAKE 2 GUMMIES DAILY   Yes [provider]  OVER THE COUNTER MEDICATION daily. TAKE 500 MG-TAKE 2 GUMMIES DAILY-TUMERIC   Yes [provider]  ALPRAZolam (XANAX) 1 MG tablet Take 1 tablet by mouth daily as needed. Patient not taking: Reported on 11/25/2021    [provider]  lithium carbonate (LITHOBID) 300 MG CR tablet TAKE 1 TO 2 TABLETS BY MOUTH DAILY AS DIRECTED    [provider]   Social History    Socioeconomic History   Marital status: Married    Spouse name: Not on file   Number of children: Not on file   Years of education: Not on file   Highest education level: Not on file  Occupational History   Not on file  Tobacco Use   Smoking status: Never   Smokeless tobacco: Never  Vaping Use   Vaping Use: Never used  Substance and Sexual Activity   Alcohol use: No   Drug use: Not Currently   Sexual activity: Yes  Other Topics Concern   Not on file  Social History Narrative   Not on file   Social Determinants of Health   Financial Resource Strain: Not on file  Food Insecurity: Not on file  Transportation Needs: Not on file  Physical Activity: Not on file  Stress: Not on file  Social Connections: Not on file  Intimate Partner Violence: Not on file    Review of Systems   Objective:   Vitals:   06/24/22 0853  BP: 128/80  Pulse: 78  Temp: 98 F (36.7 C)  SpO2: 99%  Weight: 180 lb 12.8 oz (82 kg)  Height: '5\' 4"'$  (1.626 m)     Physical Exam Vitals reviewed.  Constitutional:      Appearance: Normal appearance. She is well-developed.  HENT:     Head: Normocephalic and atraumatic.  Eyes:     Conjunctiva/sclera: Conjunctivae normal.     Pupils: Pupils are equal, round, and reactive to light.  Neck:     Vascular: No carotid bruit.  Cardiovascular:     Rate and Rhythm: Normal rate and regular rhythm.     Heart sounds: Normal heart sounds.  Pulmonary:     Effort: Pulmonary effort is normal.     Breath sounds: Normal breath sounds.  Abdominal:     Palpations: Abdomen is soft. There is no pulsatile mass.     Tenderness: There is no abdominal tenderness.  Musculoskeletal:     Right lower leg: No edema.     Left lower leg: No edema.     Comments: Neg tinel, cool sensation with phalen bilaterally. NVI distally both hands.   Skin:    General: Skin is warm and dry.  Neurological:     Mental Status: She is alert and oriented to person, place, and time.   Psychiatric:        Mood and Affect: Mood normal.        Behavior: Behavior normal.     Assessment & Plan:  Lindsay Valencia is a 46 y.o. female . Essential hypertension - Plan: Basic metabolic panel, lisinopril (ZESTRIL) 5 MG tablet  Elevated hemoglobin (HCC) - Plan: CBC  Anxiety  Paresthesia of both hands   Meds ordered this encounter  Medications   lisinopril (ZESTRIL) 5 MG tablet    Sig: Take 1 tablet (5 mg total) by mouth daily.    Dispense:  90 tablet    Refill:  1   Patient Instructions  I agree with the email to Dr. Albertine Patricia - hopefully she will some insight into anxiety symptoms. I do not think those are due to lisinopril and blood pressure looks better. No changes for now.  Please follow up if hand/foot symptoms persist or worsen. We can refer you to neuro or back to hand specialist if needed. Take care.     Signed,   Merri Ray, MD [facility] 06/24/22 9:34 AM

## 2022-06-25 ENCOUNTER — Encounter: Payer: Self-pay | Admitting: Family Medicine

## 2022-06-30 DIAGNOSIS — F321 Major depressive disorder, single episode, moderate: Secondary | ICD-10-CM | POA: Diagnosis not present

## 2022-07-13 DIAGNOSIS — F321 Major depressive disorder, single episode, moderate: Secondary | ICD-10-CM | POA: Diagnosis not present

## 2022-07-20 DIAGNOSIS — F321 Major depressive disorder, single episode, moderate: Secondary | ICD-10-CM | POA: Diagnosis not present

## 2022-08-04 DIAGNOSIS — F321 Major depressive disorder, single episode, moderate: Secondary | ICD-10-CM | POA: Diagnosis not present

## 2022-08-10 DIAGNOSIS — F321 Major depressive disorder, single episode, moderate: Secondary | ICD-10-CM | POA: Diagnosis not present

## 2022-08-18 DIAGNOSIS — S41112A Laceration without foreign body of left upper arm, initial encounter: Secondary | ICD-10-CM | POA: Diagnosis not present

## 2022-08-18 DIAGNOSIS — F321 Major depressive disorder, single episode, moderate: Secondary | ICD-10-CM | POA: Diagnosis not present

## 2022-08-19 DIAGNOSIS — Z124 Encounter for screening for malignant neoplasm of cervix: Secondary | ICD-10-CM | POA: Diagnosis not present

## 2022-08-19 DIAGNOSIS — N915 Oligomenorrhea, unspecified: Secondary | ICD-10-CM | POA: Diagnosis not present

## 2022-08-19 DIAGNOSIS — Z30432 Encounter for removal of intrauterine contraceptive device: Secondary | ICD-10-CM | POA: Diagnosis not present

## 2022-08-19 DIAGNOSIS — Z1231 Encounter for screening mammogram for malignant neoplasm of breast: Secondary | ICD-10-CM | POA: Diagnosis not present

## 2022-08-19 DIAGNOSIS — Z01419 Encounter for gynecological examination (general) (routine) without abnormal findings: Secondary | ICD-10-CM | POA: Diagnosis not present

## 2022-08-19 LAB — HM MAMMOGRAPHY

## 2022-08-21 ENCOUNTER — Encounter: Payer: Self-pay | Admitting: Family Medicine

## 2022-08-24 DIAGNOSIS — F321 Major depressive disorder, single episode, moderate: Secondary | ICD-10-CM | POA: Diagnosis not present

## 2022-08-31 DIAGNOSIS — F321 Major depressive disorder, single episode, moderate: Secondary | ICD-10-CM | POA: Diagnosis not present

## 2022-09-07 DIAGNOSIS — F321 Major depressive disorder, single episode, moderate: Secondary | ICD-10-CM | POA: Diagnosis not present

## 2022-09-14 DIAGNOSIS — F321 Major depressive disorder, single episode, moderate: Secondary | ICD-10-CM | POA: Diagnosis not present

## 2022-09-21 DIAGNOSIS — F321 Major depressive disorder, single episode, moderate: Secondary | ICD-10-CM | POA: Diagnosis not present

## 2022-09-23 ENCOUNTER — Ambulatory Visit: Payer: BC Managed Care – PPO | Admitting: Family Medicine

## 2022-09-23 ENCOUNTER — Encounter: Payer: Self-pay | Admitting: Family Medicine

## 2022-09-23 VITALS — BP 130/72 | HR 70 | Temp 98.4°F | Ht 64.0 in | Wt 183.8 lb

## 2022-09-23 DIAGNOSIS — I10 Essential (primary) hypertension: Secondary | ICD-10-CM

## 2022-09-23 DIAGNOSIS — R739 Hyperglycemia, unspecified: Secondary | ICD-10-CM | POA: Diagnosis not present

## 2022-09-23 DIAGNOSIS — Z6831 Body mass index (BMI) 31.0-31.9, adult: Secondary | ICD-10-CM

## 2022-09-23 LAB — BASIC METABOLIC PANEL
BUN: 17 mg/dL (ref 6–23)
CO2: 30 mEq/L (ref 19–32)
Calcium: 9.9 mg/dL (ref 8.4–10.5)
Chloride: 103 mEq/L (ref 96–112)
Creatinine, Ser: 0.81 mg/dL (ref 0.40–1.20)
GFR: 85.61 mL/min (ref >60.00)
Glucose, Bld: 102 mg/dL — ABNORMAL HIGH (ref 70–99)
Potassium: 4.2 mEq/L (ref 3.5–5.1)
Sodium: 142 mEq/L (ref 135–145)

## 2022-09-23 LAB — HEMOGLOBIN A1C: Hgb A1c MFr Bld: 5.5 % (ref 4.6–6.5)

## 2022-09-23 MED ORDER — LISINOPRIL 5 MG PO TABS: 5.0000 mg | ORAL_TABLET | Freq: Every day | ORAL | 3 refills | Status: DC

## 2022-09-23 NOTE — Patient Instructions (Addendum)
No change in blood pressure med at this time. If any lab concerns I will let you know.  Try to eat some form of breakfast daily, try not to skip meals. If continued difficulty with weight loss, follow up and we can discuss other options. Take care and thanks for coming in today.

## 2022-09-23 NOTE — Assessment & Plan Note (Addendum)
Well managed with lisinopril 30m qd.  Continue same with monitoring labs ordered.

## 2022-09-23 NOTE — Progress Notes (Signed)
Subjective:  Patient ID: Lindsay Valencia, female    DOB: 03-24-1976  Age: 47 y.o. MRN: [id number]  CC:  Chief Complaint  Patient presents with   Hypertension    Pt notes no physical sxs     HPI Lindsay Valencia presents for   Hypertension: Started on location in October of last year due to persistent elevated blood pressures.  Lisinopril 5 mg daily.  No new concerns today.  Outside readings - 110/70 at gynecology.  Home readings: 115/70. No side effects. No cough, no new side effects.  BP Readings from Last 3 Encounters:  09/23/22 130/72  06/24/22 128/80  05/20/22 (!) 144/92   Lab Results  Component Value Date   CREATININE 0.72 06/24/2022   Body mass index is 31.55 kg/m. Wt Readings from Last 3 Encounters:  09/23/22 183 lb 12.8 oz (83.4 kg)  06/24/22 180 lb 12.8 oz (82 kg)  05/20/22 180 lb (81.6 kg)  Had IUD removed last month - mirena prior 8 years.  Exercising intermittent, but stays active.  Sweet tea/soda - avoiding usually.  Rare fast food, takeout.  Has tried [employer].  Breakfast - skips usually. 2 meals per day. Sometimes 1.  No results found for: "HGBA1C" Glucose 103 4 months ago. Gestational DM during 1st pregnancy.      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  Constitutional:      Appearance: Normal appearance. She is well-developed.  HENT:     Head: Normocephalic and atraumatic.  Eyes:     Conjunctiva/sclera: Conjunctivae normal.     Pupils: Pupils are equal, round, and reactive to light.  Neck:     Vascular: No carotid bruit.  Cardiovascular:     Rate and Rhythm: Normal rate and regular rhythm.     Heart sounds: Normal heart sounds.  Pulmonary:     Effort: Pulmonary effort is normal.     Breath sounds: Normal breath sounds.  Abdominal:     Palpations: Abdomen is soft. There is no pulsatile mass.     Tenderness: There is no abdominal tenderness.  Musculoskeletal:     Right lower leg: No edema.     Left lower leg: No edema.  Skin:    General: Skin is warm and dry.  Neurological:     Mental Status: She is alert and oriented to person, place, and time.  Psychiatric:        Mood and Affect: Mood normal.        Behavior: Behavior normal.        Assessment &  Plan:  Lindsay Valencia is a 47 y.o. female . Essential hypertension Assessment & Plan: Well managed with lisinopril 49m qd.  Continue same with monitoring labs ordered.  Orders: -     Lisinopril; Take 1 tablet (5 mg total) by mouth daily.  Dispense: 90 tablet; Refill: 3 -     Basic metabolic panel  BMI [id number] -Breakfast daily, avoid skipping meals, continue to stay active with exercise recommendations discussed with FITT principle.   Hyperglycemia -     Basic metabolic panel -     Hemoglobin A1c  -Check labs, borderline glucose few months ago.  Recheck in 8 months for physical.   Patient Instructions  No change in blood pressure med at this time. If any lab concerns I will let you know.  Try to eat some form of breakfast daily, try not to skip meals. If continued difficulty with weight loss, follow up and we can discuss other options. Take care and thanks for coming in  today.     Signed,   JMerri Ray MD [facility] 09/23/22 10:31 AM

## 2022-09-30 DIAGNOSIS — F321 Major depressive disorder, single episode, moderate: Secondary | ICD-10-CM | POA: Diagnosis not present

## 2022-10-05 DIAGNOSIS — F321 Major depressive disorder, single episode, moderate: Secondary | ICD-10-CM | POA: Diagnosis not present

## 2022-10-12 DIAGNOSIS — F321 Major depressive disorder, single episode, moderate: Secondary | ICD-10-CM | POA: Diagnosis not present

## 2022-10-14 DIAGNOSIS — W5501XA Bitten by cat, initial encounter: Secondary | ICD-10-CM | POA: Diagnosis not present

## 2022-10-14 DIAGNOSIS — M79671 Pain in right foot: Secondary | ICD-10-CM | POA: Diagnosis not present

## 2022-10-14 DIAGNOSIS — L03115 Cellulitis of right lower limb: Secondary | ICD-10-CM | POA: Diagnosis not present

## 2022-10-15 ENCOUNTER — Other Ambulatory Visit: Payer: Self-pay

## 2022-10-15 ENCOUNTER — Emergency Department (HOSPITAL_COMMUNITY)
Admission: EM | Admit: 2022-10-15 | Discharge: 2022-10-15 | Disposition: A | Discharge status: Home / Self Care | Payer: BC Managed Care – PPO | Attending: Emergency Medicine | Admitting: Emergency Medicine

## 2022-10-15 ENCOUNTER — Emergency Department (HOSPITAL_COMMUNITY): Payer: BC Managed Care – PPO

## 2022-10-15 ENCOUNTER — Encounter (HOSPITAL_COMMUNITY): Payer: Self-pay

## 2022-10-15 DIAGNOSIS — Z79899 Other long term (current) drug therapy: Secondary | ICD-10-CM | POA: Diagnosis not present

## 2022-10-15 DIAGNOSIS — M79671 Pain in right foot: Secondary | ICD-10-CM | POA: Diagnosis not present

## 2022-10-15 DIAGNOSIS — I10 Essential (primary) hypertension: Secondary | ICD-10-CM | POA: Diagnosis not present

## 2022-10-15 DIAGNOSIS — W5501XA Bitten by cat, initial encounter: Secondary | ICD-10-CM | POA: Diagnosis not present

## 2022-10-15 DIAGNOSIS — S81851A Open bite, right lower leg, initial encounter: Secondary | ICD-10-CM | POA: Diagnosis not present

## 2022-10-15 DIAGNOSIS — L089 Local infection of the skin and subcutaneous tissue, unspecified: Secondary | ICD-10-CM | POA: Diagnosis not present

## 2022-10-15 LAB — CBC WITH DIFFERENTIAL/PLATELET
Abs Immature Granulocytes: 0.02 10*3/uL (ref 0.00–0.07)
Basophils Absolute: 0 10*3/uL (ref 0.0–0.1)
Basophils Relative: 0 %
Eosinophils Absolute: 0.1 10*3/uL (ref 0.0–0.5)
Eosinophils Relative: 1 %
HCT: 40.3 % (ref 36.0–46.0)
Hemoglobin: 13.7 g/dL (ref 12.0–15.0)
Immature Granulocytes: 0 %
Lymphocytes Relative: 20 %
Lymphs Abs: 1.8 10*3/uL (ref 0.7–4.0)
MCH: 30.6 pg (ref 26.0–34.0)
MCHC: 34 g/dL (ref 30.0–36.0)
MCV: 90 fL (ref 80.0–100.0)
Monocytes Absolute: 0.5 10*3/uL (ref 0.1–1.0)
Monocytes Relative: 6 %
Neutro Abs: 6.6 10*3/uL (ref 1.7–7.7)
Neutrophils Relative %: 73 %
Platelets: 205 10*3/uL (ref 150–400)
RBC: 4.48 MIL/uL (ref 3.87–5.11)
RDW: 13 % (ref 11.5–15.5)
WBC: 9.1 10*3/uL (ref 4.0–10.5)
nRBC: 0 % (ref 0.0–0.2)

## 2022-10-15 LAB — LACTIC ACID, PLASMA: Lactic Acid, Venous: 1.3 mmol/L (ref 0.5–1.9)

## 2022-10-15 LAB — COMPREHENSIVE METABOLIC PANEL
ALT: 19 U/L (ref 0–44)
AST: 18 U/L (ref 15–41)
Albumin: 3.6 g/dL (ref 3.5–5.0)
Alkaline Phosphatase: 58 U/L (ref 38–126)
Anion gap: 9 (ref 5–15)
BUN: 10 mg/dL (ref 6–20)
CO2: 26 mmol/L (ref 22–32)
Calcium: 8.8 mg/dL — ABNORMAL LOW (ref 8.9–10.3)
Chloride: 104 mmol/L (ref 98–111)
Creatinine, Ser: 0.83 mg/dL (ref 0.44–1.00)
GFR, Estimated: >60 mL/min (ref >60)
Glucose, Bld: 101 mg/dL — ABNORMAL HIGH (ref 70–99)
Potassium: 3.4 mmol/L — ABNORMAL LOW (ref 3.5–5.1)
Sodium: 139 mmol/L (ref 135–145)
Total Bilirubin: 0.8 mg/dL (ref 0.3–1.2)
Total Protein: 7 g/dL (ref 6.5–8.1)

## 2022-10-15 LAB — I-STAT BETA HCG BLOOD, ED (MC, WL, AP ONLY): I-stat hCG, quantitative: <5 m[IU]/mL (ref <5)

## 2022-10-15 MED ORDER — CLINDAMYCIN HCL 150 MG PO CAPS: 300.0000 mg | ORAL_CAPSULE | Freq: Four times a day (QID) | ORAL | 0 refills | Status: AC

## 2022-10-15 MED ORDER — CIPROFLOXACIN HCL 500 MG PO TABS
500.0000 mg | ORAL_TABLET | Freq: Once | ORAL | Status: AC
  Administered 2022-10-15: 500 mg via ORAL
  Filled 2022-10-15: qty 1

## 2022-10-15 MED ORDER — CLINDAMYCIN HCL 150 MG PO CAPS
300.0000 mg | ORAL_CAPSULE | Freq: Once | ORAL | Status: AC
  Administered 2022-10-15: 300 mg via ORAL
  Filled 2022-10-15: qty 2

## 2022-10-15 MED ORDER — CIPROFLOXACIN HCL 500 MG PO TABS: 500.0000 mg | ORAL_TABLET | Freq: Two times a day (BID) | ORAL | 0 refills | Status: DC

## 2022-10-15 MED ORDER — ACETAMINOPHEN 500 MG PO TABS
1000.0000 mg | ORAL_TABLET | Freq: Once | ORAL | Status: AC
  Administered 2022-10-15: 1000 mg via ORAL
  Filled 2022-10-15: qty 2

## 2022-10-15 NOTE — ED Provider Triage Note (Signed)
Emergency Medicine Provider Triage Evaluation Note  Lindsay Valencia , a 47 y.o. female  was evaluated in triage.  Pt complains of cat bite and infection to the right lower extremity.  She was bitten Tuesday morning.  She has had 3 doses of doxycycline and a shot of antibiotic at her physician's office.  She is unable to take Augmentin due to penicillin allergy.  Patient has worsening pain and swelling in her foot.  She was sent in by her PCP for potential failed antibiotics..  Review of Systems  Positive: Right foot swelling and pain Negative: Fever  Physical Exam  BP (!) 142/81 (BP Location: Right Arm)   Pulse 88   Temp 99.6 F (37.6 C) (Oral)   Resp 16   Ht '5\' 4"'$  (1.626 m)   Wt 83.9 kg   SpO2 100%   BMI 31.76 kg/m  Gen:   Awake, no distress   Resp:  Normal effort  MSK:   Moves extremities without difficulty  Other:  Will puncture wounds and scratches to the right lower extremity Erythema swelling and tenderness noted Medical Decision Making  Medically screening exam initiated at 11:55 AM.  Appropriate orders placed.  Lindsay Valencia was informed that the remainder of the evaluation will be completed by another provider, this initial triage assessment does not replace that evaluation, and the importance of remaining in the ED until their evaluation is complete.          Margarita Mail, PA-C 10/15/22 1158

## 2022-10-15 NOTE — ED Provider Notes (Signed)
[facility] Provider Note   CSN: [id number] Arrival date & time: 10/15/22  1110     History  Chief Complaint  Patient presents with   Animal Bite    Failed OP abx - cellulitis and cat bite    Leg Pain    Lindsay Valencia is a 47 y.o. female.   Animal Bite Leg Pain  This patient is a 47 year old female, she has a history of hypertension, she is not a diabetic.  She presents to the hospital today with a complaint of right foot pain and swelling.  She reports that approximately 48 hours ago she was scratched by her cat and bitten by her cat on the right foot, this occurred at night.  She was seen the next day by her family doctor and started on doxycycline however the redness and swelling seems to be gradually worsening.  She has no fevers or chills.  She was told to come to the emergency department for further evaluation.    Home Medications Prior to Admission medications   Medication Sig Start Date End Date Taking? Authorizing Provider  ciprofloxacin (CIPRO) 500 MG tablet Take 1 tablet (500 mg total) by mouth every 12 (twelve) hours. 10/15/22  Yes Noemi Chapel, MD  clindamycin (CLEOCIN) 150 MG capsule Take 2 capsules (300 mg total) by mouth 4 (four) times daily for 10 days. May dispense as '150mg'$  capsules 10/15/22 10/25/22 Yes Noemi Chapel, MD  ALPRAZolam Duanne Moron) 1 MG tablet Take 1 tablet by mouth daily as needed.    [provider]  clonazePAM (KLONOPIN) 0.5 MG tablet Take 0.25 mg by mouth as needed for anxiety.    [provider]  desvenlafaxine (PRISTIQ) 50 MG 24 hr tablet Take 50 mg by mouth daily. 11/19/18   [provider]  lisinopril (ZESTRIL) 5 MG tablet Take 1 tablet (5 mg total) by mouth daily. 09/23/22   Wendie Agreste, MD  Multiple Vitamins-Minerals (MULTIVITAMIN GUMMIES ADULT PO) Take by mouth. VITA FUSION TAKE 2 GUMMIES DAILY    [provider]  OVER THE COUNTER MEDICATION daily. TAKE 500  MG-TAKE 2 GUMMIES DAILY-TUMERIC    [provider]      Allergies    Penicillin g and Penicillins    Review of Systems   Review of Systems  All other systems reviewed and are negative.   Physical Exam Updated Vital Signs BP (!) 130/110 (BP Location: Left Arm)   Pulse 81   Temp 98.4 F (36.9 C)   Resp 17   Ht 1.626 m ('5\' 4"'$ )   Wt 83.9 kg   SpO2 100%   BMI 31.76 kg/m  Physical Exam Vitals and nursing note reviewed.  Constitutional:      General: She is not in acute distress.    Appearance: She is well-developed.  HENT:     Head: Normocephalic and atraumatic.     Mouth/Throat:     Pharynx: No oropharyngeal exudate.  Eyes:     General: No scleral icterus.       Right eye: No discharge.        Left eye: No discharge.     Conjunctiva/sclera: Conjunctivae normal.     Pupils: Pupils are equal, round, and reactive to light.  Neck:     Thyroid: No thyromegaly.     Vascular: No JVD.  Cardiovascular:     Rate and Rhythm: Normal rate and regular rhythm.     Heart sounds: Normal heart sounds.  No murmur heard.    No friction rub. No gallop.  Pulmonary:     Effort: Pulmonary effort is normal. No respiratory distress.     Breath sounds: Normal breath sounds. No wheezing or rales.  Abdominal:     General: Bowel sounds are normal. There is no distension.     Palpations: Abdomen is soft. There is no mass.     Tenderness: There is no abdominal tenderness.  Musculoskeletal:        General: No tenderness. Normal range of motion.     Cervical back: Normal range of motion and neck supple.  Lymphadenopathy:     Cervical: No cervical adenopathy.  Skin:    General: Skin is warm and dry.     Findings: Erythema and rash present.     Comments: The right foot has signs of multiple scratches as well as a couple of puncture marks.  There is no definite induration or fluid collection.  There is diffuse erythema of the entire right foot with a small amount at the ankle.  There is no  streaking up the leg and no lymphadenopathy of the leg.  Full range of motion of the ankle.  Minimal tenderness over the foot.  Neurological:     Mental Status: She is alert.     Coordination: Coordination normal.  Psychiatric:        Behavior: Behavior normal.     ED Results / Procedures / Treatments   Labs (all labs ordered are listed, but only abnormal results are displayed) Labs Reviewed  COMPREHENSIVE METABOLIC PANEL - Abnormal; Notable for the following components:      Result Value   Potassium 3.4 (*)    Glucose, Bld 101 (*)    Calcium 8.8 (*)    All other components within normal limits  CULTURE, BLOOD (ROUTINE X 2)  CULTURE, BLOOD (ROUTINE X 2)  CBC WITH DIFFERENTIAL/PLATELET  LACTIC ACID, PLASMA  I-STAT BETA HCG BLOOD, ED ([facility], [facility], [facility] ONLY)    EKG None  Radiology DG Foot Complete Right  Result Date: 10/15/2022 CLINICAL DATA:  Left foot infection.  Cat scratch. EXAM: RIGHT FOOT COMPLETE - 3+ VIEW COMPARISON:  None. FINDINGS: No evidence for an acute fracture. No subluxation or dislocation. No radiopaque soft tissue foreign body. No gross bony destruction to suggest overt osteomyelitis. IMPRESSION: No acute bony findings. No radiopaque soft tissue foreign body. Electronically Signed   By: Misty Stanley M.D.   On: 10/15/2022 12:38    Procedures Procedures    Medications Ordered in ED Medications  clindamycin (CLEOCIN) capsule 300 mg (has no administration in time range)  ciprofloxacin (CIPRO) tablet 500 mg (has no administration in time range)  acetaminophen (TYLENOL) tablet 1,000 mg (1,000 mg Oral Given 10/15/22 1804)    ED Course/ Medical Decision Making/ A&P                             Medical Decision Making Risk Prescription drug management.   This patient presents to the ED for concern of pain and redness to the right foot differential diagnosis includes cellulitis, cat scratch fever,    Additional history obtained:  Additional history obtained  from electronic medical record External records from outside source obtained and reviewed including the patient's family doctor office visits   Lab Tests:  I Ordered, and personally interpreted labs.  The pertinent results include: Metabolic panel which is unremarkable and a CBC without a leukocytosis  Imaging Studies ordered:  I ordered imaging studies including x-ray of the foot was obtained I independently visualized and interpreted imaging which showed no foreign body, no deep tissue infection I agree with the radiologist interpretation   Medicines ordered and prescription drug management:  I ordered medication including Clinda and Cipro for infection given that the patient has allergy to AMOXICILLIN Reevaluation of the patient after these medicines showed that the patient improved I have reviewed the patients home medicines and have made adjustments as needed   Problem List / ED Course:  Cellulitis secondary to cat bite, well-appearing, no signs of sepsis, patient agreeable to oral antibiotics, states she wanted to avoid an IV   Social Determinants of Health:  Patient aware of the indications for return           Final Clinical Impression(s) / ED Diagnoses Final diagnoses:  Cat bite, initial encounter    Rx / DC Orders ED Discharge Orders          Ordered    ciprofloxacin (CIPRO) 500 MG tablet  Every 12 hours        10/15/22 1841    clindamycin (CLEOCIN) 150 MG capsule  4 times daily        10/15/22 1841              Noemi Chapel, MD 10/15/22 1842

## 2022-10-15 NOTE — ED Triage Notes (Signed)
Pt came in via POV d/t Her Rt foot swollen/Red from startling her cat & it bit & scratched the top of that foot early Tuesday morning. She was seen by her PCP & started on ABT, it has not gotten better & walking is painful, denies fevers, A/Ox4.

## 2022-10-15 NOTE — ED Notes (Signed)
Patient verbalizes understanding of discharge instructions. Opportunity for questioning and answers were provided. Pt discharged from ED. 

## 2022-10-15 NOTE — Discharge Instructions (Signed)
Please take a picture of your foot with a cell phone so that you can track the appearance of the foot and make sure it is not getting worse.  The redness and swelling may actually get a little worse over the next 24 hours but then should gradually improve.  If you develop severe pain or vomiting return to the emergency department.  Please have your family doctor recheck you within 3 days.  Cipro twice a day for 10 days  Clindamycin 4 times daily for 10 days  Tylenol or ibuprofen for pain.

## 2022-10-19 DIAGNOSIS — F321 Major depressive disorder, single episode, moderate: Secondary | ICD-10-CM | POA: Diagnosis not present

## 2022-10-20 LAB — CULTURE, BLOOD (ROUTINE X 2)
Culture: NO GROWTH
Special Requests: ADEQUATE

## 2022-10-26 DIAGNOSIS — F321 Major depressive disorder, single episode, moderate: Secondary | ICD-10-CM | POA: Diagnosis not present

## 2022-11-02 DIAGNOSIS — F321 Major depressive disorder, single episode, moderate: Secondary | ICD-10-CM | POA: Diagnosis not present

## 2022-11-16 DIAGNOSIS — H524 Presbyopia: Secondary | ICD-10-CM | POA: Diagnosis not present

## 2022-11-16 DIAGNOSIS — H5213 Myopia, bilateral: Secondary | ICD-10-CM | POA: Diagnosis not present

## 2022-11-16 DIAGNOSIS — F321 Major depressive disorder, single episode, moderate: Secondary | ICD-10-CM | POA: Diagnosis not present

## 2022-11-23 DIAGNOSIS — F321 Major depressive disorder, single episode, moderate: Secondary | ICD-10-CM | POA: Diagnosis not present

## 2022-11-30 DIAGNOSIS — F321 Major depressive disorder, single episode, moderate: Secondary | ICD-10-CM | POA: Diagnosis not present

## 2022-12-14 DIAGNOSIS — F321 Major depressive disorder, single episode, moderate: Secondary | ICD-10-CM | POA: Diagnosis not present

## 2022-12-21 DIAGNOSIS — F321 Major depressive disorder, single episode, moderate: Secondary | ICD-10-CM | POA: Diagnosis not present

## 2023-02-03 ENCOUNTER — Other Ambulatory Visit: Payer: Self-pay | Admitting: Family Medicine

## 2023-02-03 DIAGNOSIS — I10 Essential (primary) hypertension: Secondary | ICD-10-CM

## 2023-03-01 DIAGNOSIS — F321 Major depressive disorder, single episode, moderate: Secondary | ICD-10-CM | POA: Diagnosis not present

## 2023-03-24 DIAGNOSIS — F332 Major depressive disorder, recurrent severe without psychotic features: Secondary | ICD-10-CM | POA: Diagnosis not present

## 2023-03-24 DIAGNOSIS — F422 Mixed obsessional thoughts and acts: Secondary | ICD-10-CM | POA: Diagnosis not present

## 2023-03-24 DIAGNOSIS — F411 Generalized anxiety disorder: Secondary | ICD-10-CM | POA: Diagnosis not present

## 2023-03-26 DIAGNOSIS — R61 Generalized hyperhidrosis: Secondary | ICD-10-CM | POA: Diagnosis not present

## 2023-04-13 DIAGNOSIS — F321 Major depressive disorder, single episode, moderate: Secondary | ICD-10-CM | POA: Diagnosis not present

## 2023-05-11 DIAGNOSIS — F321 Major depressive disorder, single episode, moderate: Secondary | ICD-10-CM | POA: Diagnosis not present

## 2023-05-26 ENCOUNTER — Encounter: Payer: Self-pay | Admitting: Family Medicine

## 2023-05-26 ENCOUNTER — Ambulatory Visit (INDEPENDENT_AMBULATORY_CARE_PROVIDER_SITE_OTHER): Payer: BC Managed Care – PPO | Admitting: Family Medicine

## 2023-05-26 VITALS — BP 124/70 | HR 76 | Temp 98.3°F | Ht 64.25 in | Wt 181.0 lb

## 2023-05-26 DIAGNOSIS — Z1329 Encounter for screening for other suspected endocrine disorder: Secondary | ICD-10-CM

## 2023-05-26 DIAGNOSIS — Z13220 Encounter for screening for lipoid disorders: Secondary | ICD-10-CM

## 2023-05-26 DIAGNOSIS — Z131 Encounter for screening for diabetes mellitus: Secondary | ICD-10-CM

## 2023-05-26 DIAGNOSIS — Z23 Encounter for immunization: Secondary | ICD-10-CM | POA: Diagnosis not present

## 2023-05-26 DIAGNOSIS — Z0000 Encounter for general adult medical examination without abnormal findings: Secondary | ICD-10-CM | POA: Diagnosis not present

## 2023-05-26 DIAGNOSIS — Z130 Encounter for screening for diseases of the blood and blood-forming organs and certain disorders involving the immune mechanism: Secondary | ICD-10-CM | POA: Diagnosis not present

## 2023-05-26 DIAGNOSIS — I10 Essential (primary) hypertension: Secondary | ICD-10-CM

## 2023-05-26 LAB — COMPREHENSIVE METABOLIC PANEL
ALT: 28 U/L (ref 0–35)
AST: 24 U/L (ref 0–37)
Albumin: 4.4 g/dL (ref 3.5–5.2)
Alkaline Phosphatase: 63 U/L (ref 39–117)
BUN: 15 mg/dL (ref 6–23)
CO2: 27 meq/L (ref 19–32)
Calcium: 9.3 mg/dL (ref 8.4–10.5)
Chloride: 102 meq/L (ref 96–112)
Creatinine, Ser: 0.76 mg/dL (ref 0.40–1.20)
GFR: 91.97 mL/min (ref >60.00)
Glucose, Bld: 97 mg/dL (ref 70–99)
Potassium: 3.5 meq/L (ref 3.5–5.1)
Sodium: 137 meq/L (ref 135–145)
Total Bilirubin: 0.5 mg/dL (ref 0.2–1.2)
Total Protein: 7.1 g/dL (ref 6.0–8.3)

## 2023-05-26 LAB — CBC WITH DIFFERENTIAL/PLATELET
Basophils Absolute: 0 10*3/uL (ref 0.0–0.1)
Basophils Relative: 0.4 % (ref 0.0–3.0)
Eosinophils Absolute: 0.2 10*3/uL (ref 0.0–0.7)
Eosinophils Relative: 3.1 % (ref 0.0–5.0)
HCT: 42.7 % (ref 36.0–46.0)
Hemoglobin: 14.2 g/dL (ref 12.0–15.0)
Lymphocytes Relative: 27.4 % (ref 12.0–46.0)
Lymphs Abs: 1.8 10*3/uL (ref 0.7–4.0)
MCHC: 33.2 g/dL (ref 30.0–36.0)
MCV: 89.1 fL (ref 78.0–100.0)
Monocytes Absolute: 0.4 10*3/uL (ref 0.1–1.0)
Monocytes Relative: 5.9 % (ref 3.0–12.0)
Neutro Abs: 4.1 10*3/uL (ref 1.4–7.7)
Neutrophils Relative %: 63.2 % (ref 43.0–77.0)
Platelets: 225 10*3/uL (ref 150.0–400.0)
RBC: 4.8 Mil/uL (ref 3.87–5.11)
RDW: 13.2 % (ref 11.5–15.5)
WBC: 6.4 10*3/uL (ref 4.0–10.5)

## 2023-05-26 LAB — LIPID PANEL
Cholesterol: 178 mg/dL (ref 0–200)
HDL: 51.1 mg/dL (ref >39.00)
LDL Cholesterol: 112 mg/dL — ABNORMAL HIGH (ref 0–99)
NonHDL: 127.19
Total CHOL/HDL Ratio: 3
Triglycerides: 75 mg/dL (ref 0.0–149.0)
VLDL: 15 mg/dL (ref 0.0–40.0)

## 2023-05-26 LAB — TSH: TSH: 1.48 u[IU]/mL (ref 0.35–5.50)

## 2023-05-26 MED ORDER — LISINOPRIL 5 MG PO TABS: 5.0000 mg | ORAL_TABLET | Freq: Every day | ORAL | 3 refills | Status: DC

## 2023-05-26 NOTE — Patient Instructions (Addendum)
Thanks for coming today.  No med changes at this time.  If any concerns on labs I will let you know. Goal exercise 150 minutes/week.  Starting off with walking is great.  Let me know if there are questions and take care!  Preventive Care 26-49 Years Old, Female Preventive care refers to lifestyle choices and visits with your health care provider that can promote health and wellness. Preventive care visits are also called wellness exams. What can I expect for my preventive care visit? Counseling Your health care provider may ask you questions about your: Medical history, including: Past medical problems. Family medical history. Pregnancy history. Current health, including: Menstrual cycle. Method of birth control. Emotional well-being. Home life and relationship well-being. Sexual activity and sexual health. Lifestyle, including: Alcohol, nicotine or tobacco, and drug use. Access to firearms. Diet, exercise, and sleep habits. Work and work Astronomer. Sunscreen use. Safety issues such as seatbelt and bike helmet use. Physical exam Your health care provider will check your: Height and weight. These may be used to calculate your BMI (body mass index). BMI is a measurement that tells if you are at a healthy weight. Waist circumference. This measures the distance around your waistline. This measurement also tells if you are at a healthy weight and may help predict your risk of certain diseases, such as type 2 diabetes and high blood pressure. Heart rate and blood pressure. Body temperature. Skin for abnormal spots. What immunizations do I need?  Vaccines are usually given at various ages, according to a schedule. Your health care provider will recommend vaccines for you based on your age, medical history, and lifestyle or other factors, such as travel or where you work. What tests do I need? Screening Your health care provider may recommend screening tests for certain conditions.  This may include: Lipid and cholesterol levels. Diabetes screening. This is done by checking your blood sugar (glucose) after you have not eaten for a while (fasting). Pelvic exam and Pap test. Hepatitis B test. Hepatitis C test. HIV (human immunodeficiency virus) test. STI (sexually transmitted infection) testing, if you are at risk. Lung cancer screening. Colorectal cancer screening. Mammogram. Talk with your health care provider about when you should start having regular mammograms. This may depend on whether you have a family history of breast cancer. BRCA-related cancer screening. This may be done if you have a family history of breast, ovarian, tubal, or peritoneal cancers. Bone density scan. This is done to screen for osteoporosis. Talk with your health care provider about your test results, treatment options, and if necessary, the need for more tests. Follow these instructions at home: Eating and drinking  Eat a diet that includes fresh fruits and vegetables, whole grains, lean protein, and low-fat dairy products. Take vitamin and mineral supplements as recommended by your health care provider. Do not drink alcohol if: Your health care provider tells you not to drink. You are pregnant, may be pregnant, or are planning to become pregnant. If you drink alcohol: Limit how much you have to 0-1 drink a day. Know how much alcohol is in your drink. In the [place] one drink equals one 12 oz bottle of beer (355 mL), one 5 oz glass of wine (148 mL), or one 1 oz glass of hard liquor (44 mL). Lifestyle Brush your teeth every morning and night with fluoride toothpaste. Floss one time each day. Exercise for at least 30 minutes 5 or more days each week. Do not use any products that contain nicotine  or tobacco. These products include cigarettes, chewing tobacco, and vaping devices, such as e-cigarettes. If you need help quitting, ask your health care provider. Do not use drugs. If you are  sexually active, practice safe sex. Use a condom or other form of protection to prevent STIs. If you do not wish to become pregnant, use a form of birth control. If you plan to become pregnant, see your health care provider for a prepregnancy visit. Take aspirin only as told by your health care provider. Make sure that you understand how much to take and what form to take. Work with your health care provider to find out whether it is safe and beneficial for you to take aspirin daily. Find healthy ways to manage stress, such as: Meditation, yoga, or listening to music. Journaling. Talking to a trusted person. Spending time with friends and family. Minimize exposure to UV radiation to reduce your risk of skin cancer. Safety Always wear your seat belt while driving or riding in a vehicle. Do not drive: If you have been drinking alcohol. Do not ride with someone who has been drinking. When you are tired or distracted. While texting. If you have been using any mind-altering substances or drugs. Wear a helmet and other protective equipment during sports activities. If you have firearms in your house, make sure you follow all gun safety procedures. Seek help if you have been physically or sexually abused. What's next? Visit your health care provider once a year for an annual wellness visit. Ask your health care provider how often you should have your eyes and teeth checked. Stay up to date on all vaccines. This information is not intended to replace advice given to you by your health care provider. Make sure you discuss any questions you have with your health care provider. Document Revised: 01/15/2021 Document Reviewed: 01/15/2021 Elsevier Patient Education  2024 [employer].

## 2023-05-26 NOTE — Progress Notes (Unsigned)
Subjective:  Patient ID: Lindsay Valencia, female    DOB: 1975-12-07  Age: 47 y.o. MRN: [id number]  CC:  Chief Complaint  Patient presents with   Annual Exam    Pt doing well, no questions at this time, pt is fasting     HPI Lindsay Valencia presents for Annual Exam PCP, me Ophthalmology, Dr. Nile Riggs prior - plan for new optho in February.  Gynecology, Dr. Billy Coast.  Rheumatology - Dr. Kathi Ludwig, inflammatory arthropathy with as needed follow-up. Psychiatry, Dr. Madaline Guthrie with therapist Vickey Sages.  Hypertension: Treated with lisinopril 5 mg daily. No new side effects.  Home readings: 120/70 range.  BP Readings from Last 3 Encounters:  05/26/23 124/70  10/15/22 (!) 138/91  09/23/22 130/72   Lab Results  Component Value Date   CREATININE 0.83 10/15/2022    Depression with anxiety Treated by psychiatry, Pristiq with alprazolam if needed, Klonopin for sleep.  Ongoing follow-up with psychiatry and therapist visits - intermittent.  On 100mg  pristiq.as needed use of alprazolam/klonopin. No SI.     05/26/2023    9:10 AM 06/24/2022    8:56 AM 05/20/2022    9:19 AM 11/19/2021    1:15 PM 09/03/2016   11:57 AM  Depression screen PHQ 2/9  Decreased Interest 1 0 0 1 0  Down, Depressed, Hopeless 1 0 0 0 0  PHQ - 2 Score 2 0 0 1 0  Altered sleeping 1 0 0 2   Tired, decreased energy 0 0 0 1   Change in appetite 1 0 0 0   Feeling bad or failure about yourself  0 0 0 0   Trouble concentrating 0 0 0 0   Moving slowly or fidgety/restless 0 0 0 0   Suicidal thoughts 0 0 0 0   PHQ-9 Score 4 0 0 4   Difficult doing work/chores Not difficult at all        Health Maintenance  Topic Date Due   Cervical Cancer Screening (HPV/Pap Cotest)  Never done   INFLUENZA VACCINE  03/04/2023   COVID-19 Vaccine (5 - 2023-24 season) 04/04/2023   Colonoscopy  12/23/2024   DTaP/Tdap/Td (3 - Td or Tdap) 05/20/2032   Hepatitis C Screening  Completed   HIV Screening  Completed   HPV VACCINES  Aged Out   Colonoscopy 12/2021 - repeat 3 yrs.  Pap and mammogram in January at [facility].  No new skin lesions. No recent derm eval  Immunization History  Administered Date(s) Administered   Influenza,inj,Quad PF,6+ Mos 05/20/2022   PFIZER Comirnaty(Gray Top)Covid-19 Tri-Sucrose Vaccine 10/14/2019, 11/04/2019, 07/20/2020   Tdap 12/18/2011, 05/20/2022  Flu vaccine today.  Covid booster - defers. No recent infection.   No results found. Optho in February, wears glasses. New optho planned in February - prior retired.  Dental: yearly. No recent dental changes. S/p crown   Alcohol: none  Tobacco: none  Exercise: staring walking few days per week.    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   Dental crowns present    Depression    History of  kidney stones    Horner's syndrome    PP SVD 12/17/11 F 12/18/2011   Past Surgical History:  Procedure Laterality Date   CARPAL TUNNEL RELEASE Right 04/25/2015   Procedure: RIGHT CARPAL TUNNEL RELEASE;  Surgeon: Cindee Salt, MD;  Location: [facility];  Service: Orthopedics;  Laterality: Right;   CARPAL TUNNEL RELEASE Left 05/23/2015   Procedure: LEFT CARPAL TUNNEL RELEASE;  Surgeon: Cindee Salt, MD;  Location: [facility];  Service: Orthopedics;  Laterality: Left;   EXTRACORPOREAL SHOCK WAVE LITHOTRIPSY Right 12/05/2012   EXTRACORPOREAL SHOCK WAVE LITHOTRIPSY Left 09/29/2010   EYE SURGERY     FOR HORNER'S SYNDROME   Allergies  Allergen Reactions   Penicillin G Other (See Comments)   Penicillins Rash   Prior to  Admission medications   Medication Sig Start Date End Date Taking? Authorizing Provider  ALPRAZolam Prudy Feeler) 1 MG tablet Take 1 tablet by mouth daily as needed.   Yes [provider]  clonazePAM (KLONOPIN) 0.5 MG tablet Take 0.25 mg by mouth as needed for anxiety.   Yes [provider]  lisinopril (ZESTRIL) 5 MG tablet TAKE 1 TABLET(5 MG) BY MOUTH DAILY 02/03/23  Yes Shade Flood, MD  Multiple Vitamins-Minerals (MULTIVITAMIN GUMMIES ADULT PO) Take by mouth. VITA FUSION TAKE 2 GUMMIES DAILY   Yes [provider]  OVER THE COUNTER MEDICATION daily. TAKE 500 MG-TAKE 2 GUMMIES DAILY-TUMERIC   Yes [provider]   Social History   Socioeconomic History   Marital status: Married    Spouse name: Not on file   Number of children: Not on file   Years of education: Not on file   Highest education level: Not on file  Occupational History   Not on file  Tobacco Use   Smoking status: Never   Smokeless tobacco: Never  Vaping Use   Vaping status: Never Used  Substance and Sexual Activity   Alcohol use: No   Drug use: Not Currently   Sexual activity: Yes  Other Topics Concern   Not on file  Social History Narrative   Not on file   Social Determinants of Health   Financial Resource Strain: Not on file  Food Insecurity: Not on file  Transportation Needs: Not on file  Physical Activity: Not on file  Stress: Not on file  Social Connections: Not on file  Intimate Partner Violence: Not on file    Review of Systems  13 point review of systems per patient health survey noted.  Negative other than as indicated above or in HPI.   Objective:   Vitals:   05/26/23 0913  BP: 124/70  Pulse: 76  Temp: 98.3 F (36.8 C)  TempSrc: Temporal  SpO2: 98%  Weight: 181 lb (82.1 kg)  Height: 5' 4.25" (1.632 m)   {Vitals History (Optional):23777}  Physical Exam Vitals reviewed.  Constitutional:      Appearance: Normal appearance. She is well-developed.   HENT:     Head: Normocephalic and atraumatic.     Right Ear: External ear normal.     Left Ear: External ear normal.  Eyes:     Conjunctiva/sclera: Conjunctivae normal.     Pupils: Pupils are equal, round, and reactive to light.  Neck:     Thyroid: No thyromegaly.     Vascular: No carotid bruit.  Cardiovascular:     Rate and Rhythm: Normal rate and regular rhythm.     Heart sounds: Normal heart sounds. No murmur heard. Pulmonary:  Effort: Pulmonary effort is normal. No respiratory distress.     Breath sounds: Normal breath sounds. No wheezing.  Abdominal:     General: Bowel sounds are normal.     Palpations: Abdomen is soft. There is no pulsatile mass.     Tenderness: There is no abdominal tenderness.  Musculoskeletal:        General: No tenderness. Normal range of motion.     Cervical back: Normal range of motion and neck supple.     Right lower leg: No edema.     Left lower leg: No edema.  Lymphadenopathy:     Cervical: No cervical adenopathy.  Skin:    General: Skin is warm and dry.     Findings: No rash.  Neurological:     Mental Status: She is alert and oriented to person, place, and time.  Psychiatric:        Mood and Affect: Mood normal.        Behavior: Behavior normal.        Thought Content: Thought content normal.        Assessment & Plan:  ZOEIGH REAUME is a 47 y.o. female . Annual physical exam - Plan: CBC with Differential/Platelet, Comprehensive metabolic panel, Lipid panel, TSH  - -anticipatory guidance as below in AVS, screening labs above. Health maintenance items as above in HPI discussed/recommended as applicable.   Essential hypertension - Plan: CBC with Differential/Platelet, lisinopril (ZESTRIL) 5 MG tablet  -  Stable, tolerating current regimen. Medications refilled. Labs pending as above.   Screening for diabetes mellitus - Plan: Comprehensive metabolic panel  Screening, anemia, deficiency, iron - Plan: CBC with  Differential/Platelet  Screening for hyperlipidemia - Plan: Comprehensive metabolic panel, Lipid panel  Screening for thyroid disorder - Plan: TSH  Needs flu shot - Plan: Flu vaccine trivalent PF, 6mos and older(Flulaval,Afluria,Fluarix,Fluzone)   Meds ordered this encounter  Medications   lisinopril (ZESTRIL) 5 MG tablet    Sig: Take 1 tablet (5 mg total) by mouth daily.    Dispense:  90 tablet    Refill:  3   Patient Instructions  Thanks for coming today.  No med changes at this time.  If any concerns on labs I will let you know. Goal exercise 150 minutes/week.  Starting off with walking is great.  Let me know if there are questions and take care!  Preventive Care 53-67 Years Old, Female Preventive care refers to lifestyle choices and visits with your health care provider that can promote health and wellness. Preventive care visits are also called wellness exams. What can I expect for my preventive care visit? Counseling Your health care provider may ask you questions about your: Medical history, including: Past medical problems. Family medical history. Pregnancy history. Current health, including: Menstrual cycle. Method of birth control. Emotional well-being. Home life and relationship well-being. Sexual activity and sexual health. Lifestyle, including: Alcohol, nicotine or tobacco, and drug use. Access to firearms. Diet, exercise, and sleep habits. Work and work Astronomer. Sunscreen use. Safety issues such as seatbelt and bike helmet use. Physical exam Your health care provider will check your: Height and weight. These may be used to calculate your BMI (body mass index). BMI is a measurement that tells if you are at a healthy weight. Waist circumference. This measures the distance around your waistline. This measurement also tells if you are at a healthy weight and may help predict your risk of certain diseases, such as type 2 diabetes and high blood  pressure. Heart rate and blood pressure. Body temperature. Skin for abnormal spots. What immunizations do I need?  Vaccines are usually given at various ages, according to a schedule. Your health care provider will recommend vaccines for you based on your age, medical history, and lifestyle or other factors, such as travel or where you work. What tests do I need? Screening Your health care provider may recommend screening tests for certain conditions. This may include: Lipid and cholesterol levels. Diabetes screening. This is done by checking your blood sugar (glucose) after you have not eaten for a while (fasting). Pelvic exam and Pap test. Hepatitis B test. Hepatitis C test. HIV (human immunodeficiency virus) test. STI (sexually transmitted infection) testing, if you are at risk. Lung cancer screening. Colorectal cancer screening. Mammogram. Talk with your health care provider about when you should start having regular mammograms. This may depend on whether you have a family history of breast cancer. BRCA-related cancer screening. This may be done if you have a family history of breast, ovarian, tubal, or peritoneal cancers. Bone density scan. This is done to screen for osteoporosis. Talk with your health care provider about your test results, treatment options, and if necessary, the need for more tests. Follow these instructions at home: Eating and drinking  Eat a diet that includes fresh fruits and vegetables, whole grains, lean protein, and low-fat dairy products. Take vitamin and mineral supplements as recommended by your health care provider. Do not drink alcohol if: Your health care provider tells you not to drink. You are pregnant, may be pregnant, or are planning to become pregnant. If you drink alcohol: Limit how much you have to 0-1 drink a day. Know how much alcohol is in your drink. In the [place] one drink equals one 12 oz bottle of beer (355 mL), one 5 oz glass of wine  (148 mL), or one 1 oz glass of hard liquor (44 mL). Lifestyle Brush your teeth every morning and night with fluoride toothpaste. Floss one time each day. Exercise for at least 30 minutes 5 or more days each week. Do not use any products that contain nicotine or tobacco. These products include cigarettes, chewing tobacco, and vaping devices, such as e-cigarettes. If you need help quitting, ask your health care provider. Do not use drugs. If you are sexually active, practice safe sex. Use a condom or other form of protection to prevent STIs. If you do not wish to become pregnant, use a form of birth control. If you plan to become pregnant, see your health care provider for a prepregnancy visit. Take aspirin only as told by your health care provider. Make sure that you understand how much to take and what form to take. Work with your health care provider to find out whether it is safe and beneficial for you to take aspirin daily. Find healthy ways to manage stress, such as: Meditation, yoga, or listening to music. Journaling. Talking to a trusted person. Spending time with friends and family. Minimize exposure to UV radiation to reduce your risk of skin cancer. Safety Always wear your seat belt while driving or riding in a vehicle. Do not drive: If you have been drinking alcohol. Do not ride with someone who has been drinking. When you are tired or distracted. While texting. If you have been using any mind-altering substances or drugs. Wear a helmet and other protective equipment during sports activities. If you have firearms in your house, make sure you follow all gun safety procedures. Seek help  if you have been physically or sexually abused. What's next? Visit your health care provider once a year for an annual wellness visit. Ask your health care provider how often you should have your eyes and teeth checked. Stay up to date on all vaccines. This information is not intended to replace  advice given to you by your health care provider. Make sure you discuss any questions you have with your health care provider. Document Revised: 01/15/2021 Document Reviewed: 01/15/2021 Elsevier Patient Education  2024 [employer]     Signed,   Meredith Staggers, MD [facility], [facility] 05/26/23 10:00 AM

## 2023-05-29 ENCOUNTER — Encounter: Payer: Self-pay | Admitting: Family Medicine

## 2023-05-31 DIAGNOSIS — Z23 Encounter for immunization: Secondary | ICD-10-CM

## 2023-05-31 DIAGNOSIS — Z0000 Encounter for general adult medical examination without abnormal findings: Secondary | ICD-10-CM | POA: Diagnosis not present

## 2023-06-09 DIAGNOSIS — M25561 Pain in right knee: Secondary | ICD-10-CM | POA: Diagnosis not present

## 2023-06-23 DIAGNOSIS — F321 Major depressive disorder, single episode, moderate: Secondary | ICD-10-CM | POA: Diagnosis not present

## 2023-07-07 DIAGNOSIS — F321 Major depressive disorder, single episode, moderate: Secondary | ICD-10-CM | POA: Diagnosis not present

## 2023-07-12 DIAGNOSIS — M25561 Pain in right knee: Secondary | ICD-10-CM | POA: Diagnosis not present

## 2023-07-15 DIAGNOSIS — M25561 Pain in right knee: Secondary | ICD-10-CM | POA: Diagnosis not present

## 2023-07-15 DIAGNOSIS — M2341 Loose body in knee, right knee: Secondary | ICD-10-CM | POA: Diagnosis not present

## 2023-08-09 DIAGNOSIS — F321 Major depressive disorder, single episode, moderate: Secondary | ICD-10-CM | POA: Diagnosis not present

## 2023-08-17 DIAGNOSIS — F321 Major depressive disorder, single episode, moderate: Secondary | ICD-10-CM | POA: Diagnosis not present

## 2023-08-18 DIAGNOSIS — M2341 Loose body in knee, right knee: Secondary | ICD-10-CM | POA: Diagnosis not present

## 2023-08-24 DIAGNOSIS — Z01411 Encounter for gynecological examination (general) (routine) with abnormal findings: Secondary | ICD-10-CM | POA: Diagnosis not present

## 2023-08-24 DIAGNOSIS — Z01419 Encounter for gynecological examination (general) (routine) without abnormal findings: Secondary | ICD-10-CM | POA: Diagnosis not present

## 2023-08-24 DIAGNOSIS — Z124 Encounter for screening for malignant neoplasm of cervix: Secondary | ICD-10-CM | POA: Diagnosis not present

## 2023-08-24 DIAGNOSIS — Z1231 Encounter for screening mammogram for malignant neoplasm of breast: Secondary | ICD-10-CM | POA: Diagnosis not present

## 2023-08-24 DIAGNOSIS — Z1331 Encounter for screening for depression: Secondary | ICD-10-CM | POA: Diagnosis not present

## 2023-08-31 DIAGNOSIS — F321 Major depressive disorder, single episode, moderate: Secondary | ICD-10-CM | POA: Diagnosis not present

## 2023-09-08 DIAGNOSIS — G8918 Other acute postprocedural pain: Secondary | ICD-10-CM | POA: Diagnosis not present

## 2023-09-08 DIAGNOSIS — M2241 Chondromalacia patellae, right knee: Secondary | ICD-10-CM | POA: Diagnosis not present

## 2023-09-08 DIAGNOSIS — M94261 Chondromalacia, right knee: Secondary | ICD-10-CM | POA: Diagnosis not present

## 2023-09-08 DIAGNOSIS — M2341 Loose body in knee, right knee: Secondary | ICD-10-CM | POA: Diagnosis not present

## 2023-09-08 DIAGNOSIS — M6751 Plica syndrome, right knee: Secondary | ICD-10-CM | POA: Diagnosis not present

## 2023-09-08 DIAGNOSIS — M948X6 Other specified disorders of cartilage, lower leg: Secondary | ICD-10-CM | POA: Diagnosis not present

## 2023-11-22 DIAGNOSIS — H5213 Myopia, bilateral: Secondary | ICD-10-CM | POA: Diagnosis not present

## 2023-11-24 ENCOUNTER — Encounter: Payer: Self-pay | Admitting: Family Medicine

## 2023-11-24 ENCOUNTER — Ambulatory Visit: Payer: BC Managed Care – PPO | Admitting: Family Medicine

## 2023-11-24 VITALS — BP 122/70 | HR 78 | Temp 97.7°F | Ht 64.25 in | Wt 188.8 lb

## 2023-11-24 DIAGNOSIS — I10 Essential (primary) hypertension: Secondary | ICD-10-CM | POA: Diagnosis not present

## 2023-11-24 DIAGNOSIS — E7800 Pure hypercholesterolemia, unspecified: Secondary | ICD-10-CM | POA: Diagnosis not present

## 2023-11-24 LAB — COMPREHENSIVE METABOLIC PANEL WITH GFR
ALT: 14 U/L (ref 0–35)
AST: 17 U/L (ref 0–37)
Albumin: 4.4 g/dL (ref 3.5–5.2)
Alkaline Phosphatase: 70 U/L (ref 39–117)
BUN: 15 mg/dL (ref 6–23)
CO2: 26 meq/L (ref 19–32)
Calcium: 9.3 mg/dL (ref 8.4–10.5)
Chloride: 103 meq/L (ref 96–112)
Creatinine, Ser: 0.86 mg/dL (ref 0.40–1.20)
GFR: 79.02 mL/min (ref >60.00)
Glucose, Bld: 98 mg/dL (ref 70–99)
Potassium: 3.9 meq/L (ref 3.5–5.1)
Sodium: 139 meq/L (ref 135–145)
Total Bilirubin: 0.5 mg/dL (ref 0.2–1.2)
Total Protein: 7.1 g/dL (ref 6.0–8.3)

## 2023-11-24 LAB — LIPID PANEL
Cholesterol: 199 mg/dL (ref 0–200)
HDL: 55.5 mg/dL (ref >39.00)
LDL Cholesterol: 122 mg/dL — ABNORMAL HIGH (ref 0–99)
NonHDL: 143.03
Total CHOL/HDL Ratio: 4
Triglycerides: 106 mg/dL (ref 0.0–149.0)
VLDL: 21.2 mg/dL (ref 0.0–40.0)

## 2023-11-24 NOTE — Progress Notes (Signed)
 Subjective:  Patient ID: Lindsay Valencia, female    DOB: [birth year]  Age: 48 y.o. MRN: [id number]  CC:  Chief Complaint  Patient presents with   Medical Management of Chronic Issues    Pt is doing well, notes has knee surgery in Feb went well no action required,     HPI Lindsay Valencia presents for   Hypertension: Lisinopril  5 mg daily without any new side effects. Knee surgery in February, Dr. Brunilda Capra, loose body in the right knee, improved. Doing better. Working in heel pain now. Plantar fasciitis. This has limited exercise and perimenopausal.  Home readings: BP Readings from Last 3 Encounters:  11/24/23 122/70  05/26/23 124/70  10/15/22 (!) 138/91   Wt Readings from Last 3 Encounters:  11/24/23 188 lb 12.8 oz (85.6 kg)  05/26/23 181 lb (82.1 kg)  10/15/22 185 lb (83.9 kg)    Lab Results  Component Value Date   CREATININE 0.76 05/26/2023   Hyperlipidemia: Elevated LDL last visit - no chol meds.  The 10-year ASCVD risk score (Arnett DK, et al., 2019) is: 1.3%   Values used to calculate the score:     Age: 82 years     Sex: Female     Is Non-Hispanic African American: No     Diabetic: No     Tobacco smoker: No     Systolic Blood Pressure: 122 mmHg     Is BP treated: Yes     HDL Cholesterol: 51.1 mg/dL     Total Cholesterol: 178 mg/dL  Lab Results  Component Value Date   CHOL 178 05/26/2023   HDL 51.10 05/26/2023   LDLCALC 112 (H) 05/26/2023   TRIG 75.0 05/26/2023   CHOLHDL 3 05/26/2023   Lab Results  Component Value Date   ALT 28 05/26/2023   AST 24 05/26/2023   ALKPHOS 63 05/26/2023   BILITOT 0.5 05/26/2023    She is followed by psychiatry for depression/anxiety treated with Pristiq, alprazolam if needed, Klonopin for sleep.    11/24/2023   10:03 AM 05/26/2023    9:10 AM 06/24/2022    8:56 AM 05/20/2022    9:19 AM 11/19/2021    1:15 PM  Depression screen PHQ 2/9  Decreased Interest 1 1 0 0 1  Down, Depressed, Hopeless 0 1 0 0 0  PHQ - 2 Score 1  2 0 0 1  Altered sleeping 1 1 0 0 2  Tired, decreased energy 0 0 0 0 1  Change in appetite 0 1 0 0 0  Feeling bad or failure about yourself  0 0 0 0 0  Trouble concentrating 0 0 0 0 0  Moving slowly or fidgety/restless 0 0 0 0 0  Suicidal thoughts 0 0 0 0 0  PHQ-9 Score 2 4 0 0 4  Difficult doing work/chores  Not difficult at all      HM: Pap and mammogram in January with Dr. Harless Lien.  Covid booster - deferred.     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   Dental crowns present  Depression    History of kidney stones    Horner's syndrome    PP SVD 12/17/11 F 12/18/2011   Past Surgical History:  Procedure Laterality Date   CARPAL TUNNEL RELEASE Right 04/25/2015   Procedure: RIGHT CARPAL TUNNEL RELEASE;  Surgeon: Lyanne Sample, MD;  Location: [facility];  Service: Orthopedics;  Laterality: Right;   CARPAL TUNNEL RELEASE Left 05/23/2015   Procedure: LEFT CARPAL TUNNEL RELEASE;  Surgeon: Lyanne Sample, MD;  Location: [facility];  Service: Orthopedics;  Laterality: Left;   EXTRACORPOREAL SHOCK WAVE LITHOTRIPSY Right 12/05/2012   EXTRACORPOREAL SHOCK WAVE LITHOTRIPSY Left 09/29/2010   EYE SURGERY     FOR HORNER'S SYNDROME   Allergies  Allergen Reactions   Penicillin G Other (See Comments)   Penicillins Rash   Prior to Admission medications   Medication Sig Start Date End Date Taking? Authorizing Provider  ALPRAZolam (XANAX) 1 MG tablet Take 1 tablet by mouth daily as needed.   Yes [provider]  clonazePAM (KLONOPIN) 0.5 MG  tablet Take 0.25 mg by mouth as needed for anxiety.   Yes [provider]  lisinopril  (ZESTRIL ) 5 MG tablet Take 1 tablet (5 mg total) by mouth daily. 05/26/23  Yes Benjiman Bras, MD  Multiple Vitamins-Minerals (MULTIVITAMIN GUMMIES ADULT PO) Take by mouth. VITA FUSION TAKE 2 GUMMIES DAILY   Yes [provider]  OVER THE COUNTER MEDICATION daily. TAKE 500 MG-TAKE 2 GUMMIES DAILY-TUMERIC   Yes [provider]   Social History   Socioeconomic History   Marital status: Married    Spouse name: Not on file   Number of children: Not on file   Years of education: Not on file   Highest education level: Bachelor's degree (e.g., BA, AB, BS)  Occupational History   Not on file  Tobacco Use   Smoking status: Never   Smokeless tobacco: Never  Vaping Use   Vaping status: Never Used  Substance and Sexual Activity   Alcohol use: No   Drug use: Not Currently   Sexual activity: Yes  Other Topics Concern   Not on file  Social History Narrative   Not on file   Social Drivers of Health   Financial Resource Strain: Low Risk  (11/20/2023)   Overall Financial Resource Strain (CARDIA)    Difficulty of Paying Living Expenses: Not hard at all  Food Insecurity: No Food Insecurity (11/20/2023)   Hunger Vital Sign    Worried About Running Out of Food in the Last Year: Never true    Ran Out of Food in the Last Year: Never true  Transportation Needs: No Transportation Needs (11/20/2023)   PRAPARE - Administrator, Civil Service (Medical): No    Lack of Transportation (Non-Medical): No  Physical Activity: Unknown (11/20/2023)   Exercise Vital Sign    Days of Exercise per Week: Patient declined    Minutes of Exercise per Session: Not on file  Stress: Patient Declined (11/20/2023)   [employer] of Occupational Health - Occupational Stress Questionnaire    Feeling of Stress : Patient declined  Social Connections: Unknown (11/20/2023)   Social Connection and  Isolation Panel [NHANES]    Frequency of Communication with Friends and Family: More than three times a week    Frequency of Social Gatherings with Friends and Family: Twice a week    Attends Religious Services: Patient declined    Database administrator or Organizations: Patient declined    Attends [employer]  or Organization Meetings: Not on file    Marital Status: Married  Catering manager Violence: Not on file    Review of Systems  Constitutional:  Negative for fatigue and unexpected weight change.  Respiratory:  Negative for chest tightness and shortness of breath.   Cardiovascular:  Negative for chest pain, palpitations and leg swelling.  Gastrointestinal:  Negative for abdominal pain and blood in stool.  Neurological:  Negative for dizziness, syncope, light-headedness and headaches.     Objective:   Vitals:   11/24/23 1003  BP: 122/70  Pulse: 78  Temp: 97.7 F (36.5 C)  TempSrc: Temporal  SpO2: 97%  Weight: 188 lb 12.8 oz (85.6 kg)  Height: 5' 4.25" (1.632 m)     Physical Exam Vitals reviewed.  Constitutional:      Appearance: Normal appearance. She is well-developed.  HENT:     Head: Normocephalic and atraumatic.  Eyes:     Conjunctiva/sclera: Conjunctivae normal.     Pupils: Pupils are equal, round, and reactive to light.  Neck:     Vascular: No carotid bruit.  Cardiovascular:     Rate and Rhythm: Normal rate and regular rhythm.     Heart sounds: Normal heart sounds.  Pulmonary:     Effort: Pulmonary effort is normal.     Breath sounds: Normal breath sounds.  Abdominal:     Palpations: Abdomen is soft. There is no pulsatile mass.     Tenderness: There is no abdominal tenderness.  Musculoskeletal:     Right lower leg: No edema.     Left lower leg: No edema.  Skin:    General: Skin is warm and dry.  Neurological:     Mental Status: She is alert and oriented to person, place, and time.  Psychiatric:        Mood and Affect: Mood normal.        Behavior:  Behavior normal.        Assessment & Plan:  Lindsay Valencia is a 48 y.o. female . Essential hypertension - Plan: Comprehensive metabolic panel with GFR, Lipid panel  Elevated LDL cholesterol level - Plan: Lipid panel  Stable, tolerating current regimen.  Continue same dose lisinopril , has refills from last visit.  Check labs, elevated LDL previously but low ASCVD risk score.  May be slightly increased with decreased activity/exercise from knee, foot pain but no new meds for now.  Discussed follow-up with GYN if needed regarding perimenopausal symptoms and medication treatment options.  No orders of the defined types were placed in this encounter.  Patient Instructions  No medication changes at this time.  Let me know if there are questions, but I will check some labs and let you know if there are any concerns.  Follow-up in 6 months for physical, happy to see you sooner if needed.  Follow-up with OB/GYN regarding any specific questions for meds/treatments of perimenopausal symptoms.  Take care!    Signed,   Caro Christmas, MD [facility], [facility] 11/24/23 10:50 AM

## 2023-11-24 NOTE — Patient Instructions (Signed)
 No medication changes at this time.  Let me know if there are questions, but I will check some labs and let you know if there are any concerns.  Follow-up in 6 months for physical, happy to see you sooner if needed.  Follow-up with OB/GYN regarding any specific questions for meds/treatments of perimenopausal symptoms.  Take care!

## 2023-11-30 ENCOUNTER — Encounter: Payer: Self-pay | Admitting: Family Medicine

## 2023-12-21 DIAGNOSIS — F321 Major depressive disorder, single episode, moderate: Secondary | ICD-10-CM | POA: Diagnosis not present

## 2024-03-07 DIAGNOSIS — F330 Major depressive disorder, recurrent, mild: Secondary | ICD-10-CM | POA: Diagnosis not present

## 2024-04-21 DIAGNOSIS — F330 Major depressive disorder, recurrent, mild: Secondary | ICD-10-CM | POA: Diagnosis not present

## 2024-05-29 ENCOUNTER — Encounter: Payer: Self-pay | Admitting: Family Medicine

## 2024-05-29 ENCOUNTER — Ambulatory Visit: Admitting: Family Medicine

## 2024-05-29 VITALS — BP 110/74 | HR 78 | Temp 98.3°F | Resp 14 | Ht 64.25 in | Wt 191.6 lb

## 2024-05-29 DIAGNOSIS — Z0000 Encounter for general adult medical examination without abnormal findings: Secondary | ICD-10-CM

## 2024-05-29 DIAGNOSIS — E7800 Pure hypercholesterolemia, unspecified: Secondary | ICD-10-CM | POA: Diagnosis not present

## 2024-05-29 DIAGNOSIS — R5383 Other fatigue: Secondary | ICD-10-CM | POA: Insufficient documentation

## 2024-05-29 DIAGNOSIS — R635 Abnormal weight gain: Secondary | ICD-10-CM | POA: Insufficient documentation

## 2024-05-29 DIAGNOSIS — Z131 Encounter for screening for diabetes mellitus: Secondary | ICD-10-CM

## 2024-05-29 DIAGNOSIS — Z1329 Encounter for screening for other suspected endocrine disorder: Secondary | ICD-10-CM

## 2024-05-29 DIAGNOSIS — I10 Essential (primary) hypertension: Secondary | ICD-10-CM | POA: Diagnosis not present

## 2024-05-29 DIAGNOSIS — R7989 Other specified abnormal findings of blood chemistry: Secondary | ICD-10-CM | POA: Insufficient documentation

## 2024-05-29 DIAGNOSIS — Z130 Encounter for screening for diseases of the blood and blood-forming organs and certain disorders involving the immune mechanism: Secondary | ICD-10-CM

## 2024-05-29 DIAGNOSIS — Z23 Encounter for immunization: Secondary | ICD-10-CM

## 2024-05-29 DIAGNOSIS — F32A Depression, unspecified: Secondary | ICD-10-CM | POA: Insufficient documentation

## 2024-05-29 LAB — CBC
HCT: 38.7 % (ref 36.0–46.0)
Hemoglobin: 12.7 g/dL (ref 12.0–15.0)
MCHC: 32.8 g/dL (ref 30.0–36.0)
MCV: 82.8 fl (ref 78.0–100.0)
Platelets: 222 K/uL (ref 150.0–400.0)
RBC: 4.68 Mil/uL (ref 3.87–5.11)
RDW: 14.2 % (ref 11.5–15.5)
WBC: 7.1 K/uL (ref 4.0–10.5)

## 2024-05-29 LAB — LIPID PANEL
Cholesterol: 194 mg/dL (ref 0–200)
HDL: 55 mg/dL (ref >39.00)
LDL Cholesterol: 117 mg/dL — ABNORMAL HIGH (ref 0–99)
NonHDL: 138.99
Total CHOL/HDL Ratio: 4
Triglycerides: 111 mg/dL (ref 0.0–149.0)
VLDL: 22.2 mg/dL (ref 0.0–40.0)

## 2024-05-29 LAB — COMPREHENSIVE METABOLIC PANEL WITH GFR
ALT: 11 U/L (ref 0–35)
AST: 15 U/L (ref 0–37)
Albumin: 4.2 g/dL (ref 3.5–5.2)
Alkaline Phosphatase: 57 U/L (ref 39–117)
BUN: 15 mg/dL (ref 6–23)
CO2: 27 meq/L (ref 19–32)
Calcium: 8.8 mg/dL (ref 8.4–10.5)
Chloride: 104 meq/L (ref 96–112)
Creatinine, Ser: 0.81 mg/dL (ref 0.40–1.20)
GFR: 84.6 mL/min (ref >60.00)
Glucose, Bld: 92 mg/dL (ref 70–99)
Potassium: 3.7 meq/L (ref 3.5–5.1)
Sodium: 139 meq/L (ref 135–145)
Total Bilirubin: 0.4 mg/dL (ref 0.2–1.2)
Total Protein: 6.9 g/dL (ref 6.0–8.3)

## 2024-05-29 LAB — TSH: TSH: 2.57 u[IU]/mL (ref 0.35–5.50)

## 2024-05-29 LAB — HEMOGLOBIN A1C: Hgb A1c MFr Bld: 5.9 % (ref 4.6–6.5)

## 2024-05-29 NOTE — Progress Notes (Signed)
 Subjective:  Patient ID: Lindsay Valencia, female    DOB: [birth year]  Age: 48 y.o. MRN: [id number]  CC:  Chief Complaint  Patient presents with   Annual Exam   Foot Injury    Sx started since having knee surgery. Both heels are hurting her.     HPI [name] presents for Annual Exam and bilateral heel pain. No other health changes.   PCP, me [facility] Dr. Robinson, appointment 11/22/2023. GYN, Dr. Gorge,  appointment in January, who hsa now retired - plnas to establish with new GYN.  Sports medicine, Dr. Arnaldo, evaluated for right knee pain last year, then seen by orthopedist Dr. Kay for loose body in knee, surgery earlier this year in February.  Did discuss heel pain at her April visit, plantar fasciitis which was affecting exercise. Heel pain as persisted, about the same or worse. Tried stretches from ortho, tried orthotic inserts - felt worse. Sandals during summer. Still pain in both heels. Sore with prolonged walking. Sore in am, some days worse than others. Occasional alleve/ibuprofen  - min relief. Plans to call ortho to follow up.  Rheumatology, previously followed by Dr. Leni, inflammatory arthropathy with as needed follow-up. Psychiatry, Dr. Lucyann, therapist, [facility].  Retired. New psychiatrist Dr. Vincente.  Depression with anxiety, treated with Trintellix past 5 weeks. alprazolam if needed, Klonopin if needed for sleep infrequent use.   Hypertension: Lisinopril  5 mg daily. Home readings: none recent.  No side effects.  BP Readings from Last 3 Encounters:  05/29/24 110/74  11/24/23 122/70  05/26/23 124/70   Lab Results  Component Value Date   CREATININE 0.86 11/24/2023        05/29/2024    8:56 AM 11/24/2023   10:03 AM 05/26/2023    9:10 AM 06/24/2022    8:56 AM 05/20/2022    9:19 AM  Depression screen PHQ 2/9  Decreased Interest 1 1 1  0 0  Down, Depressed, Hopeless 0 0 1 0 0  PHQ - 2 Score 1 1 2  0 0  Altered sleeping 1 1 1  0 0  Tired, decreased  energy 1 0 0 0 0  Change in appetite 1 0 1 0 0  Feeling bad or failure about yourself  0 0 0 0 0  Trouble concentrating 0 0 0 0 0  Moving slowly or fidgety/restless 0 0 0 0 0  Suicidal thoughts 0 0 0 0 0  PHQ-9 Score 4 2 4  0 0  Difficult doing work/chores Not difficult at all  Not difficult at all      Health Maintenance  Topic Date Due   Hepatitis B Vaccines 19-59 Average Risk (1 of 3 - 19+ 3-dose series) Never done   Cervical Cancer Screening (HPV/Pap Cotest)  Never done   Pneumococcal Vaccine: 50+ Years (1 of 1 - PCV) Never done   Zoster Vaccines- Shingrix (1 of 2) Never done   Influenza Vaccine  03/03/2024   COVID-19 Vaccine (5 - 2025-26 season) 06/14/2024 (Originally 04/03/2024)   Mammogram  08/19/2024   Colonoscopy  12/23/2024   DTaP/Tdap/Td (4 - Td or Tdap) 05/20/2032   Hepatitis C Screening  Completed   HIV Screening  Completed   HPV VACCINES  Aged Out   Meningococcal B Vaccine  Aged Out  Colonoscopy in May 2023 with plan to repeat at 3 years. Pap testing and mammograms through GYN as above. Up to date.   Immunization History  Administered Date(s) Administered   Influenza, Seasonal, Injecte, Preservative Fre  05/28/2011, 05/31/2023   Influenza,inj,Quad PF,6+ Mos 05/20/2022   Influenza-Unspecified 06/09/2021   PFIZER Comirnaty(Gray Top)Covid-19 Tri-Sucrose Vaccine 10/14/2019, 11/04/2019, 07/20/2020   Td 08/04/2003   Tdap 12/18/2011, 05/20/2022   Unspecified SARS-COV-2 Vaccination 07/22/2020  Flu vaccine today.  Declines COVID booster. Prevnar recommended - today.  Shingles vaccine - 1st dose today.  Hep B vaccine - defers for now.   No results found. Optho 4/21 as above.   Dental: Yearly dental visits.  Alcohol: None  Tobacco: none.   Exercise: some walking.  Some decreased stamina. No CP/DOE. Past year decreased stamina. Tired with exertion.  Maternal GM with pacemaker.  Wt Readings from Last 3 Encounters:  05/29/24 191 lb 9.6 oz (86.9 kg)  11/24/23 188 lb  12.8 oz (85.6 kg)  05/26/23 181 lb (82.1 kg)   Lab Results  Component Value Date   HGBA1C 5.5 09/23/2022        History Patient Active Problem List   Diagnosis Date Noted   Abnormal C-reactive protein 05/29/2024   Depression 05/29/2024   Fatigue 05/29/2024   Abnormal weight gain 05/29/2024   Essential hypertension 09/23/2022   History of depression 07/17/2021   History of miscarriage 07/17/2021   Irregular periods 07/17/2021   Kidney stone 07/17/2021   Reduced libido 07/17/2021   Anemia 07/17/2021   Lyme disease 01/18/2018   Acute non-recurrent maxillary sinusitis 09/03/2016   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   Carpal tunnel syndrome of right wrist 06/21/2015   Obsessive-compulsive disorder 09/18/2014   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   History of lithotripsy 11/19/2010   Patellar dislocation 10/19/2010   Past Medical History:  Diagnosis Date   Allergies    Allergy    Penicillin-rash on torso   Anemia    Anxiety    Carpal tunnel syndrome of right wrist 04/2015   Dental crowns present    Depression    History of kidney stones    Horner's syndrome    PP SVD 12/17/11 F 12/18/2011   Past Surgical History:  Procedure Laterality Date   CARPAL TUNNEL RELEASE Right 04/25/2015   Procedure: RIGHT CARPAL TUNNEL RELEASE;  Surgeon: Arley Curia, MD;  Location: [facility];  Service: Orthopedics;  Laterality: Right;   CARPAL TUNNEL RELEASE Left 05/23/2015   Procedure: LEFT CARPAL TUNNEL RELEASE;  Surgeon: Arley Curia, MD;  Location: [facility];  Service: Orthopedics;  Laterality: Left;   EXTRACORPOREAL SHOCK WAVE LITHOTRIPSY Right 12/05/2012   EXTRACORPOREAL SHOCK WAVE LITHOTRIPSY Left 09/29/2010   EYE SURGERY     FOR HORNER'S SYNDROME   Allergies  Allergen Reactions   Penicillin G Other (See Comments)   Penicillins Rash   Prior to Admission medications   Medication Sig Start Date End Date Taking? Authorizing Provider   clonazePAM (KLONOPIN) 0.5 MG tablet Take 0.25 mg by mouth as needed for anxiety.   Yes [provider]  lisinopril  (ZESTRIL ) 5 MG tablet Take 1 tablet (5 mg total) by mouth daily. 05/26/23  Yes Levora Reyes SAUNDERS, MD  Multiple Vitamins-Minerals (MULTIVITAMIN GUMMIES ADULT PO) Take by mouth. VITA FUSION TAKE 2 GUMMIES DAILY   Yes [provider]  OVER THE COUNTER MEDICATION daily. TAKE 500 MG-TAKE 2 GUMMIES DAILY-TUMERIC   Yes [provider]  TRINTELLIX 20 MG TABS tablet Take 20 mg by mouth daily. 04/22/24  Yes [provider]  ALPRAZolam (XANAX) 1 MG tablet Take 1 tablet by mouth daily as needed.    [provider]   Social  History   Socioeconomic History   Marital status: Married    Spouse name: Not on file   Number of children: Not on file   Years of education: Not on file   Highest education level: Bachelor's degree (e.g., BA, AB, BS)  Occupational History   Not on file  Tobacco Use   Smoking status: Never   Smokeless tobacco: Never  Vaping Use   Vaping status: Never Used  Substance and Sexual Activity   Alcohol use: No   Drug use: Not Currently   Sexual activity: Yes    Birth control/protection: Condom  Other Topics Concern   Not on file  Social History Narrative   Not on file   Social Drivers of Health   Financial Resource Strain: Low Risk  (05/26/2024)   Overall Financial Resource Strain (CARDIA)    Difficulty of Paying Living Expenses: Not hard at all  Food Insecurity: No Food Insecurity (05/26/2024)   Hunger Vital Sign    Worried About Running Out of Food in the Last Year: Never true    Ran Out of Food in the Last Year: Never true  Transportation Needs: No Transportation Needs (05/26/2024)   PRAPARE - Administrator, Civil Service (Medical): No    Lack of Transportation (Non-Medical): No  Physical Activity: Unknown (05/26/2024)   Exercise Vital Sign    Days of Exercise per Week: Patient declined     Minutes of Exercise per Session: Not on file  Stress: No Stress Concern Present (05/26/2024)   [employer] of Occupational Health - Occupational Stress Questionnaire    Feeling of Stress: Only a little  Social Connections: Unknown (05/26/2024)   Social Connection and Isolation Panel    Frequency of Communication with Friends and Family: More than three times a week    Frequency of Social Gatherings with Friends and Family: More than three times a week    Attends Religious Services: More than 4 times per year    Active Member of [employer] or Organizations: Patient declined    Attends Banker Meetings: Not on file    Marital Status: Married  Catering Manager Violence: Not on file    Review of Systems 13 point review of systems per patient health survey noted.  Negative other than as indicated above or in HPI.    Objective:   Vitals:   05/29/24 0852  BP: 110/74  Pulse: 78  Resp: 14  Temp: 98.3 F (36.8 C)  TempSrc: Temporal  SpO2: 98%  Weight: 191 lb 9.6 oz (86.9 kg)  Height: 5' 4.25 (1.632 m)     Physical Exam Constitutional:      Appearance: She is well-developed.  HENT:     Head: Normocephalic and atraumatic.     Right Ear: External ear normal.     Left Ear: External ear normal.  Eyes:     Conjunctiva/sclera: Conjunctivae normal.     Pupils: Pupils are equal, round, and reactive to light.  Neck:     Thyroid : No thyromegaly.  Cardiovascular:     Rate and Rhythm: Normal rate and regular rhythm.     Heart sounds: Normal heart sounds. No murmur heard. Pulmonary:     Effort: Pulmonary effort is normal. No respiratory distress.     Breath sounds: Normal breath sounds. No wheezing.  Abdominal:     General: Bowel sounds are normal.     Palpations: Abdomen is soft.     Tenderness: There is no abdominal tenderness.  Musculoskeletal:        General: No tenderness. Normal range of motion.     Cervical back: Normal range of motion and neck supple.      Comments: Negative lateral heel squeeze bilaterally.  Notes discomfort is lower.  Lymphadenopathy:     Cervical: No cervical adenopathy.  Skin:    General: Skin is warm and dry.     Findings: No rash.  Neurological:     Mental Status: She is alert and oriented to person, place, and time.  Psychiatric:        Behavior: Behavior normal.        Thought Content: Thought content normal.    EKG: Rate 69, no acute ST or T wave changes.  Normal PR interval 154, QTc 413.Compared to 12/04/2020 EKG, no apparent significant changes.    Assessment & Plan:  Lindsay Valencia is a 48 y.o. female . Annual physical exam  - -anticipatory guidance as below in AVS, screening labs above. Health maintenance items as above in HPI discussed/recommended as applicable.   Need for influenza vaccination - Plan: Flu vaccine trivalent PF, 6mos and older(Flulaval,Afluria,Fluarix,Fluzone)  Essential hypertension - Plan: Comprehensive metabolic panel with GFR, CBC, EKG 12-Lead -  Stable, tolerating current regimen. Medications refilled. Labs pending as above.   Elevated LDL cholesterol level - Plan: Lipid panel  - Check labs and adjust plan accordingly.  Screening for diabetes mellitus - Plan: Hemoglobin A1c  Screening, anemia, deficiency, iron  Screening for thyroid  disorder Fatigue, unspecified type - Plan: Comprehensive metabolic panel with GFR, CBC, EKG 12-Lead, TSH  - Some fatigue with exercise, denies chest pain or dyspnea.  No known family history of early CAD.  Does note some increased weight that may be contributing, deconditioning also in differential.  Continue to monitor.  Check labs above.  Consider cardiology follow-up if persistent given history of elevated LDL and hypertension.  RTC/ER precautions given.  Need for pneumococcal vaccine - Plan: Pneumococcal conjugate vaccine 20-valent (Prevnar 20)  Need for shingles vaccine - Plan: Varicella-zoster vaccine IM   No orders of the defined types were  placed in this encounter.  Patient Instructions  Thank you for coming in today. No change in medications at this time.  Depending on the labs we can discuss if other testing is recommended for the fatigue with exercise/activity.  If any new or worsening symptoms I need to know.  I do recommend follow-up with Ortho regarding the persistent heel pain.  Let me know if they need a referral.  If there are any concerns on your bloodwork, I will let you know. Take care!  Preventive Care 57-72 Years Old, Female Preventive care refers to lifestyle choices and visits with your health care provider that can promote health and wellness. Preventive care visits are also called wellness exams. What can I expect for my preventive care visit? Counseling Your health care provider may ask you questions about your: Medical history, including: Past medical problems. Family medical history. Pregnancy history. Current health, including: Menstrual cycle. Method of birth control. Emotional well-being. Home life and relationship well-being. Sexual activity and sexual health. Lifestyle, including: Alcohol, nicotine or tobacco, and drug use. Access to firearms. Diet, exercise, and sleep habits. Work and work astronomer. Sunscreen use. Safety issues such as seatbelt and bike helmet use. Physical exam Your health care provider will check your: Height and weight. These may be used to calculate your BMI (body mass index). BMI is a measurement that tells if you are at  a healthy weight. Waist circumference. This measures the distance around your waistline. This measurement also tells if you are at a healthy weight and may help predict your risk of certain diseases, such as type 2 diabetes and high blood pressure. Heart rate and blood pressure. Body temperature. Skin for abnormal spots. What immunizations do I need?  Vaccines are usually given at various ages, according to a schedule. Your health care provider  will recommend vaccines for you based on your age, medical history, and lifestyle or other factors, such as travel or where you work. What tests do I need? Screening Your health care provider may recommend screening tests for certain conditions. This may include: Lipid and cholesterol levels. Diabetes screening. This is done by checking your blood sugar (glucose) after you have not eaten for a while (fasting). Pelvic exam and Pap test. Hepatitis B test. Hepatitis C test. HIV (human immunodeficiency virus) test. STI (sexually transmitted infection) testing, if you are at risk. Lung cancer screening. Colorectal cancer screening. Mammogram. Talk with your health care provider about when you should start having regular mammograms. This may depend on whether you have a family history of breast cancer. BRCA-related cancer screening. This may be done if you have a family history of breast, ovarian, tubal, or peritoneal cancers. Bone density scan. This is done to screen for osteoporosis. Talk with your health care provider about your test results, treatment options, and if necessary, the need for more tests. Follow these instructions at home: Eating and drinking  Eat a diet that includes fresh fruits and vegetables, whole grains, lean protein, and low-fat dairy products. Take vitamin and mineral supplements as recommended by your health care provider. Do not drink alcohol if: Your health care provider tells you not to drink. You are pregnant, may be pregnant, or are planning to become pregnant. If you drink alcohol: Limit how much you have to 0-1 drink a day. Know how much alcohol is in your drink. In the [place] one drink equals one 12 oz bottle of beer (355 mL), one 5 oz glass of wine (148 mL), or one 1 oz glass of hard liquor (44 mL). Lifestyle Brush your teeth every morning and night with fluoride toothpaste. Floss one time each day. Exercise for at least 30 minutes 5 or more days each  week. Do not use any products that contain nicotine or tobacco. These products include cigarettes, chewing tobacco, and vaping devices, such as e-cigarettes. If you need help quitting, ask your health care provider. Do not use drugs. If you are sexually active, practice safe sex. Use a condom or other form of protection to prevent STIs. If you do not wish to become pregnant, use a form of birth control. If you plan to become pregnant, see your health care provider for a prepregnancy visit. Take aspirin only as told by your health care provider. Make sure that you understand how much to take and what form to take. Work with your health care provider to find out whether it is safe and beneficial for you to take aspirin daily. Find healthy ways to manage stress, such as: Meditation, yoga, or listening to music. Journaling. Talking to a trusted person. Spending time with friends and family. Minimize exposure to UV radiation to reduce your risk of skin cancer. Safety Always wear your seat belt while driving or riding in a vehicle. Do not drive: If you have been drinking alcohol. Do not ride with someone who has been drinking. When you are tired  or distracted. While texting. If you have been using any mind-altering substances or drugs. Wear a helmet and other protective equipment during sports activities. If you have firearms in your house, make sure you follow all gun safety procedures. Seek help if you have been physically or sexually abused. What's next? Visit your health care provider once a year for an annual wellness visit. Ask your health care provider how often you should have your eyes and teeth checked. Stay up to date on all vaccines. This information is not intended to replace advice given to you by your health care provider. Make sure you discuss any questions you have with your health care provider. Document Revised: 01/15/2021 Document Reviewed: 01/15/2021 Elsevier Patient  Education  2024 Elsevier Inc.     Signed,   Reyes Pines, MD [facility], [facility] 05/29/24 9:23 AM

## 2024-05-29 NOTE — Patient Instructions (Addendum)
 Thank you for coming in today. No change in medications at this time.  Depending on the labs we can discuss if other testing is recommended for the fatigue with exercise/activity.  If any new or worsening symptoms I need to know.  I do recommend follow-up with Ortho regarding the persistent heel pain.  Let me know if they need a referral.  If there are any concerns on your bloodwork, I will let you know. Take care!  Preventive Care 35-50 Years Old, Female Preventive care refers to lifestyle choices and visits with your health care provider that can promote health and wellness. Preventive care visits are also called wellness exams. What can I expect for my preventive care visit? Counseling Your health care provider may ask you questions about your: Medical history, including: Past medical problems. Family medical history. Pregnancy history. Current health, including: Menstrual cycle. Method of birth control. Emotional well-being. Home life and relationship well-being. Sexual activity and sexual health. Lifestyle, including: Alcohol, nicotine or tobacco, and drug use. Access to firearms. Diet, exercise, and sleep habits. Work and work astronomer. Sunscreen use. Safety issues such as seatbelt and bike helmet use. Physical exam Your health care provider will check your: Height and weight. These may be used to calculate your BMI (body mass index). BMI is a measurement that tells if you are at a healthy weight. Waist circumference. This measures the distance around your waistline. This measurement also tells if you are at a healthy weight and may help predict your risk of certain diseases, such as type 2 diabetes and high blood pressure. Heart rate and blood pressure. Body temperature. Skin for abnormal spots. What immunizations do I need?  Vaccines are usually given at various ages, according to a schedule. Your health care provider will recommend vaccines for you based on your age,  medical history, and lifestyle or other factors, such as travel or where you work. What tests do I need? Screening Your health care provider may recommend screening tests for certain conditions. This may include: Lipid and cholesterol levels. Diabetes screening. This is done by checking your blood sugar (glucose) after you have not eaten for a while (fasting). Pelvic exam and Pap test. Hepatitis B test. Hepatitis C test. HIV (human immunodeficiency virus) test. STI (sexually transmitted infection) testing, if you are at risk. Lung cancer screening. Colorectal cancer screening. Mammogram. Talk with your health care provider about when you should start having regular mammograms. This may depend on whether you have a family history of breast cancer. BRCA-related cancer screening. This may be done if you have a family history of breast, ovarian, tubal, or peritoneal cancers. Bone density scan. This is done to screen for osteoporosis. Talk with your health care provider about your test results, treatment options, and if necessary, the need for more tests. Follow these instructions at home: Eating and drinking  Eat a diet that includes fresh fruits and vegetables, whole grains, lean protein, and low-fat dairy products. Take vitamin and mineral supplements as recommended by your health care provider. Do not drink alcohol if: Your health care provider tells you not to drink. You are pregnant, may be pregnant, or are planning to become pregnant. If you drink alcohol: Limit how much you have to 0-1 drink a day. Know how much alcohol is in your drink. In the [place] one drink equals one 12 oz bottle of beer (355 mL), one 5 oz glass of wine (148 mL), or one 1 oz glass of hard liquor (44 mL). Lifestyle  Brush your teeth every morning and night with fluoride toothpaste. Floss one time each day. Exercise for at least 30 minutes 5 or more days each week. Do not use any products that contain nicotine or  tobacco. These products include cigarettes, chewing tobacco, and vaping devices, such as e-cigarettes. If you need help quitting, ask your health care provider. Do not use drugs. If you are sexually active, practice safe sex. Use a condom or other form of protection to prevent STIs. If you do not wish to become pregnant, use a form of birth control. If you plan to become pregnant, see your health care provider for a prepregnancy visit. Take aspirin only as told by your health care provider. Make sure that you understand how much to take and what form to take. Work with your health care provider to find out whether it is safe and beneficial for you to take aspirin daily. Find healthy ways to manage stress, such as: Meditation, yoga, or listening to music. Journaling. Talking to a trusted person. Spending time with friends and family. Minimize exposure to UV radiation to reduce your risk of skin cancer. Safety Always wear your seat belt while driving or riding in a vehicle. Do not drive: If you have been drinking alcohol. Do not ride with someone who has been drinking. When you are tired or distracted. While texting. If you have been using any mind-altering substances or drugs. Wear a helmet and other protective equipment during sports activities. If you have firearms in your house, make sure you follow all gun safety procedures. Seek help if you have been physically or sexually abused. What's next? Visit your health care provider once a year for an annual wellness visit. Ask your health care provider how often you should have your eyes and teeth checked. Stay up to date on all vaccines. This information is not intended to replace advice given to you by your health care provider. Make sure you discuss any questions you have with your health care provider. Document Revised: 01/15/2021 Document Reviewed: 01/15/2021 Elsevier Patient Education  2024 [employer].

## 2024-06-06 ENCOUNTER — Ambulatory Visit: Payer: Self-pay | Admitting: Family Medicine

## 2024-06-19 ENCOUNTER — Encounter: Payer: Self-pay | Admitting: Podiatry

## 2024-06-19 ENCOUNTER — Ambulatory Visit

## 2024-06-19 ENCOUNTER — Ambulatory Visit: Admitting: Podiatry

## 2024-06-19 VITALS — Ht 64.25 in | Wt 191.6 lb

## 2024-06-19 DIAGNOSIS — M7751 Other enthesopathy of right foot: Secondary | ICD-10-CM

## 2024-06-19 DIAGNOSIS — M722 Plantar fascial fibromatosis: Secondary | ICD-10-CM | POA: Diagnosis not present

## 2024-06-19 DIAGNOSIS — M7752 Other enthesopathy of left foot: Secondary | ICD-10-CM

## 2024-06-19 MED ORDER — METHYLPREDNISOLONE 4 MG PO TBPK: ORAL_TABLET | ORAL | 0 refills | Status: AC

## 2024-06-19 MED ORDER — MELOXICAM 15 MG PO TABS: 15.0000 mg | ORAL_TABLET | Freq: Every day | ORAL | 1 refills | Status: AC

## 2024-06-19 MED ORDER — BETAMETHASONE SOD PHOS & ACET 6 (3-3) MG/ML IJ SUSP
3.0000 mg | Freq: Once | INTRAMUSCULAR | Status: AC
  Administered 2024-06-19: 3 mg via INTRA_ARTICULAR

## 2024-06-19 NOTE — Progress Notes (Signed)
   Chief Complaint  Patient presents with   Foot Pain    Pt is here due to bilateral heel pain, states this pain has been going on since February, no injuries, left foot is worse then the right, she has been stretching the foot, icing and using OTC meds with no relief.    Subjective: 48 y.o. female presenting today for evaluation of bilateral heel pain ongoing since February 2025.  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  Dermatology: Skin is warm, dry and supple bilateral lower extremities. Negative for open lesions or macerations bilateral.   Vascular: Dorsalis Pedis and Posterior Tibial pulses palpable bilateral.  Capillary fill time is immediate to all digits.  Neurological: Grossly intact via light touch  Musculoskeletal: Tenderness to palpation to the plantar aspect of the bilateral heels along the plantar fascia. All other joints range of motion within normal limits bilateral. Strength 5/5 in all groups bilateral.   Radiographic exam B/L feet 06/19/2024: Normal osseous mineralization. Joint spaces preserved.  No fractures identified.  Small plantar heel spurs noted bilateral impression: Negative  Assessment: 1. plantar fasciitis bilateral feet  Plan of Care:  -Patient evaluated. Xrays reviewed.   -Injection of 0.5cc Celestone soluspan injected into the bilateral heels.  -Rx for Medrol  Dose Pak placed - Meloxicam 15 mg daily after completion of the Dosepak - Refrain from going barefoot.  Recommend good supportive shoes and OOFOS recovery slides available at [employer]  [employer] -Instructed patient regarding therapies and modalities at home to alleviate symptoms.  -Return to clinic in 4 weeks.    Thresa EMERSON Sar, DPM [facility]  Dr. Thresa EMERSON Sar, DPM    [address] [place], [place] [address]                Office [phone]  Fax [phone]

## 2024-06-21 DIAGNOSIS — F3342 Major depressive disorder, recurrent, in full remission: Secondary | ICD-10-CM | POA: Diagnosis not present

## 2024-07-17 ENCOUNTER — Encounter: Payer: Self-pay | Admitting: Podiatry

## 2024-07-17 ENCOUNTER — Ambulatory Visit: Admitting: Podiatry

## 2024-07-17 VITALS — Ht 64.25 in | Wt 191.6 lb

## 2024-07-17 DIAGNOSIS — M722 Plantar fascial fibromatosis: Secondary | ICD-10-CM

## 2024-07-17 MED ORDER — BETAMETHASONE SOD PHOS & ACET 6 (3-3) MG/ML IJ SUSP
3.0000 mg | Freq: Once | INTRAMUSCULAR | Status: AC
  Administered 2024-07-17: 10:00:00 3 mg via INTRA_ARTICULAR

## 2024-07-17 NOTE — Progress Notes (Signed)
° °  Chief Complaint  Patient presents with   Plantar Fasciitis    Pt is here to f/u on right foot due to plantar fasciitis, she states the pain is better, last few days she has felt some pain, but not as bad a before, no other complaints.    Subjective: 48 y.o. female presenting today for follow-up evaluation of bilateral plantar fasciitis.  Overall improvement.  Brief history: Ongoing since February 2025.  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  Dermatology: Skin is warm, dry and supple bilateral lower extremities. Negative for open lesions or macerations bilateral.   Vascular: Dorsalis Pedis and Posterior Tibial pulses palpable bilateral.  Capillary fill time is immediate to all digits.  Neurological: Grossly intact via light touch  Musculoskeletal: There continues to be some tenderness to palpation to the plantar aspect of the bilateral heels along the plantar fascia. All other joints range of motion within normal limits bilateral. Strength 5/5 in all groups bilateral.   Radiographic exam B/L feet 06/19/2024: Normal osseous mineralization. Joint spaces preserved.  No fractures identified.  Small plantar heel spurs noted bilateral impression: Negative  Assessment: 1. plantar fasciitis bilateral feet; improved  Plan of Care:  -Patient evaluated.  -Injection of 0.5cc Celestone  soluspan injected into the bilateral heels.  - Continue meloxicam  15 mg daily -Continue to refrain from going barefoot.  Patient to purchase the OOFOS recovery slides and states that they help - Return to  clinic PRN  Thresa EMERSON Sar, DPM [facility]  Dr. Thresa EMERSON Sar, DPM    [address] [place], [place] [address]                Office [phone]  Fax [phone]

## 2024-08-04 ENCOUNTER — Other Ambulatory Visit: Payer: Self-pay | Admitting: Family Medicine

## 2024-08-04 DIAGNOSIS — I10 Essential (primary) hypertension: Secondary | ICD-10-CM

## 2024-11-27 ENCOUNTER — Ambulatory Visit: Admitting: Family Medicine
